# Patient Record
Sex: Male | Born: 1980 | Race: White | Hispanic: No | Marital: Married | Smoking: Current every day smoker
Health system: Southern US, Community
[De-identification: ages and names within clinical notes are randomized; demographics above are authoritative.]

## PROBLEM LIST (undated history)

## (undated) DIAGNOSIS — Z9151 Personal history of suicidal behavior: Secondary | ICD-10-CM

## (undated) DIAGNOSIS — F329 Major depressive disorder, single episode, unspecified: Secondary | ICD-10-CM

## (undated) DIAGNOSIS — A419 Sepsis, unspecified organism: Secondary | ICD-10-CM

## (undated) DIAGNOSIS — Z915 Personal history of self-harm: Secondary | ICD-10-CM

## (undated) DIAGNOSIS — F32A Depression, unspecified: Secondary | ICD-10-CM

## (undated) DIAGNOSIS — N200 Calculus of kidney: Secondary | ICD-10-CM

## (undated) DIAGNOSIS — S0285XA Fracture of orbit, unspecified, initial encounter for closed fracture: Secondary | ICD-10-CM

## (undated) DIAGNOSIS — F419 Anxiety disorder, unspecified: Secondary | ICD-10-CM

## (undated) DIAGNOSIS — F4310 Post-traumatic stress disorder, unspecified: Secondary | ICD-10-CM

## (undated) DIAGNOSIS — S2239XA Fracture of one rib, unspecified side, initial encounter for closed fracture: Secondary | ICD-10-CM

## (undated) HISTORY — PX: RENAL ARTERY STENT: SHX2321

## (undated) HISTORY — PX: LITHOTRIPSY: SUR834

---

## 2014-12-20 ENCOUNTER — Emergency Department (HOSPITAL_COMMUNITY): Payer: Self-pay

## 2014-12-20 ENCOUNTER — Emergency Department (HOSPITAL_COMMUNITY)
Admission: EM | Admit: 2014-12-20 | Discharge: 2014-12-23 | Disposition: A | Discharge status: Home / Self Care | Payer: Self-pay | Attending: Emergency Medicine | Admitting: Emergency Medicine

## 2014-12-20 ENCOUNTER — Encounter (HOSPITAL_COMMUNITY): Payer: Self-pay | Admitting: Emergency Medicine

## 2014-12-20 DIAGNOSIS — Y929 Unspecified place or not applicable: Secondary | ICD-10-CM | POA: Insufficient documentation

## 2014-12-20 DIAGNOSIS — S0012XA Contusion of left eyelid and periocular area, initial encounter: Secondary | ICD-10-CM | POA: Insufficient documentation

## 2014-12-20 DIAGNOSIS — Z79899 Other long term (current) drug therapy: Secondary | ICD-10-CM | POA: Insufficient documentation

## 2014-12-20 DIAGNOSIS — F1994 Other psychoactive substance use, unspecified with psychoactive substance-induced mood disorder: Secondary | ICD-10-CM | POA: Diagnosis present

## 2014-12-20 DIAGNOSIS — F332 Major depressive disorder, recurrent severe without psychotic features: Secondary | ICD-10-CM | POA: Diagnosis present

## 2014-12-20 DIAGNOSIS — S29001A Unspecified injury of muscle and tendon of front wall of thorax, initial encounter: Secondary | ICD-10-CM | POA: Insufficient documentation

## 2014-12-20 DIAGNOSIS — F1910 Other psychoactive substance abuse, uncomplicated: Secondary | ICD-10-CM | POA: Diagnosis present

## 2014-12-20 DIAGNOSIS — F4310 Post-traumatic stress disorder, unspecified: Secondary | ICD-10-CM | POA: Diagnosis present

## 2014-12-20 DIAGNOSIS — S3991XA Unspecified injury of abdomen, initial encounter: Secondary | ICD-10-CM | POA: Insufficient documentation

## 2014-12-20 DIAGNOSIS — F1410 Cocaine abuse, uncomplicated: Secondary | ICD-10-CM | POA: Insufficient documentation

## 2014-12-20 DIAGNOSIS — W1839XA Other fall on same level, initial encounter: Secondary | ICD-10-CM | POA: Insufficient documentation

## 2014-12-20 DIAGNOSIS — Y939 Activity, unspecified: Secondary | ICD-10-CM | POA: Insufficient documentation

## 2014-12-20 DIAGNOSIS — F329 Major depressive disorder, single episode, unspecified: Secondary | ICD-10-CM | POA: Insufficient documentation

## 2014-12-20 DIAGNOSIS — F1310 Sedative, hypnotic or anxiolytic abuse, uncomplicated: Secondary | ICD-10-CM | POA: Insufficient documentation

## 2014-12-20 DIAGNOSIS — R45851 Suicidal ideations: Secondary | ICD-10-CM

## 2014-12-20 DIAGNOSIS — F1210 Cannabis abuse, uncomplicated: Secondary | ICD-10-CM | POA: Insufficient documentation

## 2014-12-20 DIAGNOSIS — Z8781 Personal history of (healed) traumatic fracture: Secondary | ICD-10-CM | POA: Insufficient documentation

## 2014-12-20 DIAGNOSIS — Y999 Unspecified external cause status: Secondary | ICD-10-CM | POA: Insufficient documentation

## 2014-12-20 DIAGNOSIS — F419 Anxiety disorder, unspecified: Secondary | ICD-10-CM | POA: Insufficient documentation

## 2014-12-20 HISTORY — DX: Anxiety disorder, unspecified: F41.9

## 2014-12-20 HISTORY — DX: Depression, unspecified: F32.A

## 2014-12-20 HISTORY — DX: Fracture of one rib, unspecified side, initial encounter for closed fracture: S22.39XA

## 2014-12-20 HISTORY — DX: Major depressive disorder, single episode, unspecified: F32.9

## 2014-12-20 HISTORY — DX: Fracture of orbit, unspecified, initial encounter for closed fracture: S02.85XA

## 2014-12-20 LAB — URINE MICROSCOPIC-ADD ON

## 2014-12-20 LAB — RAPID URINE DRUG SCREEN, HOSP PERFORMED
AMPHETAMINES: NOT DETECTED
Barbiturates: NOT DETECTED
Benzodiazepines: POSITIVE — AB
Cocaine: POSITIVE — AB
Opiates: NOT DETECTED
Tetrahydrocannabinol: POSITIVE — AB

## 2014-12-20 LAB — COMPREHENSIVE METABOLIC PANEL
ALBUMIN: 4.5 g/dL (ref 3.5–5.0)
ALT: 22 U/L (ref 17–63)
AST: 27 U/L (ref 15–41)
Alkaline Phosphatase: 52 U/L (ref 38–126)
Anion gap: 9 (ref 5–15)
BILIRUBIN TOTAL: 0.5 mg/dL (ref 0.3–1.2)
BUN: 11 mg/dL (ref 6–20)
CALCIUM: 9.7 mg/dL (ref 8.9–10.3)
CO2: 25 mmol/L (ref 22–32)
CREATININE: 0.91 mg/dL (ref 0.61–1.24)
Chloride: 107 mmol/L (ref 101–111)
GFR calc Af Amer: >60 mL/min (ref >60)
Glucose, Bld: 94 mg/dL (ref 65–99)
Potassium: 4.4 mmol/L (ref 3.5–5.1)
SODIUM: 141 mmol/L (ref 135–145)
TOTAL PROTEIN: 7.5 g/dL (ref 6.5–8.1)

## 2014-12-20 LAB — CBC
HCT: 47.9 % (ref 39.0–52.0)
Hemoglobin: 16 g/dL (ref 13.0–17.0)
MCH: 30.8 pg (ref 26.0–34.0)
MCHC: 33.4 g/dL (ref 30.0–36.0)
MCV: 92.3 fL (ref 78.0–100.0)
PLATELETS: 229 10*3/uL (ref 150–400)
RBC: 5.19 MIL/uL (ref 4.22–5.81)
RDW: 13.6 % (ref 11.5–15.5)
WBC: 12.7 10*3/uL — ABNORMAL HIGH (ref 4.0–10.5)

## 2014-12-20 LAB — URINALYSIS, ROUTINE W REFLEX MICROSCOPIC
Glucose, UA: NEGATIVE mg/dL
Ketones, ur: 15 mg/dL — AB
Nitrite: POSITIVE — AB
PROTEIN: 30 mg/dL — AB
Specific Gravity, Urine: 1.033 — ABNORMAL HIGH (ref 1.005–1.030)
UROBILINOGEN UA: 1 mg/dL (ref 0.0–1.0)
pH: 7 (ref 5.0–8.0)

## 2014-12-20 LAB — SALICYLATE LEVEL: Salicylate Lvl: <4 mg/dL (ref 2.8–30.0)

## 2014-12-20 LAB — ETHANOL

## 2014-12-20 LAB — ACETAMINOPHEN LEVEL: Acetaminophen (Tylenol), Serum: <10 ug/mL — ABNORMAL LOW (ref 10–30)

## 2014-12-20 MED ORDER — CLONAZEPAM 0.5 MG PO TABS
1.0000 mg | ORAL_TABLET | Freq: Three times a day (TID) | ORAL | Status: DC | PRN
  Administered 2014-12-20 – 2014-12-21 (×2): 1 mg via ORAL
  Filled 2014-12-20 (×2): qty 2

## 2014-12-20 MED ORDER — CLONAZEPAM 0.5 MG PO TABS
1.0000 mg | ORAL_TABLET | Freq: Four times a day (QID) | ORAL | Status: DC
  Administered 2014-12-21 – 2014-12-23 (×8): 1 mg via ORAL
  Filled 2014-12-20 (×8): qty 2

## 2014-12-20 MED ORDER — HYDROXYZINE HCL 25 MG PO TABS
25.0000 mg | ORAL_TABLET | Freq: Three times a day (TID) | ORAL | Status: DC | PRN
  Administered 2014-12-22 (×2): 25 mg via ORAL
  Filled 2014-12-20 (×2): qty 1

## 2014-12-20 MED ORDER — ZIPRASIDONE MESYLATE 20 MG IM SOLR
20.0000 mg | Freq: Once | INTRAMUSCULAR | Status: AC
  Administered 2014-12-20: 20 mg via INTRAMUSCULAR

## 2014-12-20 MED ORDER — KETOROLAC TROMETHAMINE 60 MG/2ML IM SOLN
30.0000 mg | Freq: Once | INTRAMUSCULAR | Status: AC
  Administered 2014-12-20: 30 mg via INTRAMUSCULAR
  Filled 2014-12-20: qty 2

## 2014-12-20 MED ORDER — SULFAMETHOXAZOLE-TRIMETHOPRIM 800-160 MG PO TABS
1.0000 | ORAL_TABLET | Freq: Once | ORAL | Status: AC
  Administered 2014-12-21: 1 via ORAL
  Filled 2014-12-20: qty 1

## 2014-12-20 MED ORDER — NICOTINE 21 MG/24HR TD PT24
21.0000 mg | MEDICATED_PATCH | Freq: Every day | TRANSDERMAL | Status: DC
  Administered 2014-12-20 – 2014-12-23 (×4): 21 mg via TRANSDERMAL
  Filled 2014-12-20 (×6): qty 1

## 2014-12-20 MED ORDER — LOPERAMIDE HCL 1 MG/5ML PO LIQD
2.0000 mg | Freq: Once | ORAL | Status: AC
  Administered 2014-12-20: 2 mg via ORAL
  Filled 2014-12-20: qty 10

## 2014-12-20 NOTE — ED Notes (Addendum)
Pt is becoming very manipulative with staff and stating to the sitter that he is getting ready to get very loud. Informed the sitter that he will be getting his klonopin and will be moving to pod c. Security made aware and asked if [employer] could stay with pt in pod C for a while.

## 2014-12-20 NOTE — ED Notes (Signed)
Pt yelling in triage, demanding pain medicine and to be seen by a physician. Starting walking out of triage. Yelling to call security. Threatening to leave then walking back to his seat. Security called.

## 2014-12-20 NOTE — Progress Notes (Signed)
Patient was referred at: [facility] - per Lupita Leash, 1 bed open, fax referral. [facility] - per Thayer Ohm, fax referral for review. [facility] - per Olegario Messier, fax referral. [facility] - per intake, fax it for wait-list. [facility] - per Suann Larry, fax referral for wait-list. [facility] - per Britta Mccreedy, fax referral for review. [facility] - per intake, fax it for review.  At capacity: [facility] - per Luis Abed - per Berdine Dance - per Doyce Para - [facility]   CSW will continue to seek placement.  Melbourne Abts, LCSWA Disposition staff 12/20/2014 9:57 PM

## 2014-12-20 NOTE — ED Notes (Signed)
Sitter at bedside; Pt now denying SI. States RN misunderstood him. RN explained protocols will still be followed and he will be evaluated by mental health.

## 2014-12-20 NOTE — ED Notes (Signed)
Staffing called.  

## 2014-12-20 NOTE — ED Notes (Signed)
Pt upset demanding pain medication, CHarge RN at bedside, Littie Deeds, MD at bedside
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
## 2014-12-20 NOTE — ED Notes (Signed)
Pt states, "I cannot hold anything down by mouth. I am vomiting, peeing & puking blood. I want to talk to the attending doctor. I do not want this sitter in my room with her smug remarks. I need an IV because I cannot hold anything down. I will not take anything by mouth. I have a broken orbital & I am agitated." Charge RN & Littie Deeds, MD aware of situation & pts concerns, a new sitter is at bedside per pt request

## 2014-12-20 NOTE — ED Notes (Signed)
Pt requesting something for diarrhea. amb to the bathroom with security and sitter.
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
## 2014-12-20 NOTE — ED Notes (Signed)
[place], Consulting civil engineer at bedside, pt informed that Littie Deeds, MD will not be returning to the bedside until the pts medical hx is faxed over from [place], MD aware of pts concerns

## 2014-12-20 NOTE — ED Notes (Signed)
Refaxed IVC paperwork. 

## 2014-12-20 NOTE — ED Notes (Signed)
Pt reports that while he was washing his hands in the bathroom his head hit the mirror in the bathroom and broke the mirror. Pt then asked to sit in the chair and not get up because he reports "I am unstable on my feet." pt has been walking throughout triage with no difficulty ambulating. This RN and Matt EMT then hear a trashcan moving on the floor. Come to assess pt and pt is laying on the floor. Sitter states that pt laid himself on the floor and hit himself on the trash can. Once again asked pt to stay in his chair.

## 2014-12-20 NOTE — ED Notes (Signed)
Pt in paper scrubs.
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
## 2014-12-20 NOTE — ED Notes (Signed)
Called for triage x3. No answer 

## 2014-12-20 NOTE — ED Notes (Signed)
This RN sat and talked with pt and calmed him. Explained the process and protocols and why they have to be in place. Pt seems to accept and understand them at this time.

## 2014-12-20 NOTE — ED Notes (Signed)
Faxed IVC Paperwork to [employer] office

## 2014-12-21 DIAGNOSIS — F1994 Other psychoactive substance use, unspecified with psychoactive substance-induced mood disorder: Secondary | ICD-10-CM | POA: Diagnosis present

## 2014-12-21 DIAGNOSIS — F1910 Other psychoactive substance abuse, uncomplicated: Secondary | ICD-10-CM | POA: Diagnosis present

## 2014-12-21 DIAGNOSIS — F4310 Post-traumatic stress disorder, unspecified: Secondary | ICD-10-CM | POA: Diagnosis present

## 2014-12-21 DIAGNOSIS — R45851 Suicidal ideations: Secondary | ICD-10-CM

## 2014-12-21 DIAGNOSIS — F332 Major depressive disorder, recurrent severe without psychotic features: Secondary | ICD-10-CM | POA: Diagnosis present

## 2014-12-21 MED ORDER — ARIPIPRAZOLE 10 MG PO TABS
20.0000 mg | ORAL_TABLET | Freq: Every day | ORAL | Status: DC
Start: 2014-12-22 — End: 2014-12-23
  Administered 2014-12-22 – 2014-12-23 (×2): 20 mg via ORAL
  Filled 2014-12-21 (×2): qty 2

## 2014-12-21 MED ORDER — HALOPERIDOL LACTATE 5 MG/ML IJ SOLN
10.0000 mg | Freq: Once | INTRAMUSCULAR | Status: AC
  Administered 2014-12-21: 10 mg via INTRAMUSCULAR
  Filled 2014-12-21: qty 2

## 2014-12-21 MED ORDER — PRAZOSIN HCL 2 MG PO CAPS
2.0000 mg | ORAL_CAPSULE | Freq: Every day | ORAL | Status: DC
  Filled 2014-12-21 (×3): qty 1

## 2014-12-21 MED ORDER — PREGABALIN 75 MG PO CAPS
75.0000 mg | ORAL_CAPSULE | Freq: Three times a day (TID) | ORAL | Status: DC
  Administered 2014-12-21 (×2): 75 mg via ORAL
  Filled 2014-12-21 (×2): qty 1

## 2014-12-21 MED ORDER — PREGABALIN 75 MG PO CAPS
75.0000 mg | ORAL_CAPSULE | Freq: Three times a day (TID) | ORAL | Status: DC
  Administered 2014-12-21 – 2014-12-23 (×5): 75 mg via ORAL
  Filled 2014-12-21 (×5): qty 1

## 2014-12-21 MED ORDER — CITALOPRAM HYDROBROMIDE 10 MG PO TABS
60.0000 mg | ORAL_TABLET | Freq: Every day | ORAL | Status: DC
  Administered 2014-12-21 – 2014-12-23 (×3): 60 mg via ORAL
  Filled 2014-12-21 (×3): qty 6

## 2014-12-21 MED ORDER — LORAZEPAM 2 MG/ML IJ SOLN
2.0000 mg | Freq: Once | INTRAMUSCULAR | Status: AC
Start: 2014-12-21 — End: 2014-12-21
  Administered 2014-12-21: 2 mg via INTRAMUSCULAR
  Filled 2014-12-21: qty 1

## 2014-12-21 MED ORDER — MIRTAZAPINE 15 MG PO TABS
15.0000 mg | ORAL_TABLET | Freq: Every day | ORAL | Status: DC
  Administered 2014-12-21 – 2014-12-22 (×2): 15 mg via ORAL
  Filled 2014-12-21 (×4): qty 1

## 2014-12-21 MED ORDER — PANTOPRAZOLE SODIUM 40 MG PO TBEC
40.0000 mg | DELAYED_RELEASE_TABLET | Freq: Every day | ORAL | Status: DC
  Administered 2014-12-21 – 2014-12-23 (×3): 40 mg via ORAL
  Filled 2014-12-21 (×3): qty 1

## 2014-12-21 MED ORDER — DIVALPROEX SODIUM ER 500 MG PO TB24
750.0000 mg | ORAL_TABLET | Freq: Every day | ORAL | Status: DC
  Filled 2014-12-21: qty 1

## 2014-12-21 MED ORDER — SULFAMETHOXAZOLE-TRIMETHOPRIM 800-160 MG PO TABS
1.0000 | ORAL_TABLET | Freq: Two times a day (BID) | ORAL | Status: DC
Start: 2014-12-21 — End: 2014-12-23
  Administered 2014-12-21 – 2014-12-23 (×4): 1 via ORAL
  Filled 2014-12-21 (×4): qty 1
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
## 2014-12-21 NOTE — ED Notes (Signed)
Pt agitated, pt reports episode of diarrhea incontinence, pt provided hygeine items & new scrubs, pt states, "I feel like no one is trying to meet my needs & do anything for my agitation."

## 2014-12-21 NOTE — ED Notes (Signed)
Pt agitated sitting in hall, states, "I do not want to get agitated like yesterday. I don't know what I got yesterday but it helped the best. I just am upset that I lost this job. I don't want to get so worked up." [employer] at bedside talking with the pt, pt continues to pace in the hall

## 2014-12-21 NOTE — ED Notes (Signed)
Pt asked again to step back in his room and as soon as he calms down, he can speak to a doctor.  [employer] talking to pt right now.

## 2014-12-21 NOTE — ED Notes (Signed)
Pt agitated &  Ambulating in the hall, pt stating, "I cannot handle this I need to talk to someone. I am about to become violent. I pay the damn bills for these doctors. I don't want the sitter following me everywhere." pt will not go into his room after many attempts to redirect him into his room for a conversation on his plan of care, pt continues to raise his voice, [employer] speaking with the pt, hannah with social work at the bedside discussing plan of care, pt remains agitated, Adriana Simas, MD aware, pt to receive medication d/t agitation, pts requesting for medication for agitation

## 2014-12-21 NOTE — ED Notes (Signed)
As soon as pt received the shot he requested, his demeanor changed and he is currently polite and saying please and thank you without any foul language either.

## 2014-12-21 NOTE — Progress Notes (Signed)
LCSW addressed IVC as patient is currently committed and paperwork was faxed from [employer]. Paperwork placed on patient's chart in ED.    Patient is very agitated sitting in hallway reporting he cannot take this, GPD at bedside and LCSW brought patient in room to discuss plan.  Patient reports he is agitated because he hit his head on mirror, broke the mirror, and this has not been addressed.  When discussing with RN and reviewing notes, patient was addressed by MD and there was no intervention needed at this time.   Patient requesting medication to calm him down as well as medication for his headache. Patient shows no evidence of abrasion or contusion at this time.  He received medication, laying in bed and calm with GPD at his bedside.  Patient updated on treatment plan as he already knew and agreeable for inpatient treatment. He presents irritable, angry, and manipulative, attempting to medication seek.    Still seeking placement.  Deretha Emory, MSW Clinical Social Work: Emergency Room [phone]

## 2014-12-21 NOTE — ED Notes (Signed)
Per social workers request from [facility] to call Ricardo Sims re: questions re: pts abx tx, Rosey Bath @ [phone] was contacted & updated on pts status, per Rosey Bath she will be in contact with [facility] re: the possibility of the pt meeting acceptance criteria, currently Ricardo Sims does not have any openings at this time & placement at this facility today is not likely, [facility] to provide update on pt plan of care

## 2014-12-21 NOTE — ED Notes (Signed)
Pt asleep, Dahlia Client with social work to review IVC paperwork, sitter at bedside
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
## 2014-12-21 NOTE — ED Notes (Signed)
Pt has calmed down enough and is sitting in his room, requesting the same shot of medication that he received last night.  Informed Dr. Gwendolyn Grant who will be over to talk to pt shortly.

## 2014-12-21 NOTE — ED Notes (Signed)
Pt is yelling at staff about his situation, cursing and threatening to hit a wall.  Pt informed that nurse will talk to him once he has calmed down.  Security at door.

## 2014-12-21 NOTE — Progress Notes (Signed)
Rosey Bath at [facility] [phone]) requested updates on clinicals for a possible admission. RN Toniann Fail aware.  Melbourne Abts, LCSWA Disposition staff 12/21/2014 4:43 PM

## 2014-12-21 NOTE — ED Notes (Signed)
Ricardo Simas, MD paged per pts request, pt agitated & ambulating in the hall, pt encouraged to return to room, pt requesting to speak with social work

## 2014-12-21 NOTE — Progress Notes (Signed)
LCSW following as patient ?under IVC. Call placed to civil and criminal magistrate.  Unknown at this time if patient has been served. LCSW called Dagoberto Reef of Court and awaiting paperwork to be sent over from [employer].    Awaiting call back.  At this time, it appears patient has not been served his Findings and Custody order, thus he is voluntary.  Once paperwork can either be located to deemed not complete, LCSW will facilitate next steps in effort to determine if appropriate to IVC.  Patient to be re-assessed this morning.  Deretha Emory, MSW Clinical Social Work: Emergency Room [phone]

## 2014-12-21 NOTE — ED Notes (Signed)
Pt states he "demands to speak to someone," pt told that he needs to calm down in order for him to talk to someone.

## 2014-12-22 MED ORDER — ZIPRASIDONE MESYLATE 20 MG IM SOLR
10.0000 mg | Freq: Once | INTRAMUSCULAR | Status: AC
Start: 2014-12-22 — End: 2014-12-22
  Administered 2014-12-22: 10 mg via INTRAMUSCULAR
  Filled 2014-12-22: qty 20

## 2014-12-22 MED ORDER — ZIPRASIDONE MESYLATE 20 MG IM SOLR: 20.0000 mg | Freq: Once | INTRAMUSCULAR | Status: DC

## 2014-12-22 NOTE — ED Notes (Signed)
Pt in hallway stating that he did not know that he had papers taken out on him, even though he has been told several times. Pt becoming more and more upset and aggressive. Pt walking toward desk aggressively toward this RN and Kaitlin EMT. [employer] responded, pt became more upset stating "why are you walking toward me like that man." EDP phoned. This RN received a verbal order for Geodon . Pt sts that he wants to take this medication to "help me calm down."

## 2014-12-22 NOTE — Progress Notes (Signed)
Called Ricardo Sims to check pt's referral status as information shared in shift report indicated intake had called 12/21/14 p.m. Requesting updated clinical information. Jill Alexanders at [facility] states he does not have any information re: that request, that pt was scheduled for an assessment at [facility] on 12/16/14 and did not attend, stated he "went to an ED instead." Jill Alexanders advises to re-fax referral and it will be reviewed for [facility] waitlist. CSW faxed referral and will follow up.  Ilean Skill, MSW, LCSW Clinical Social Work, Disposition  12/22/2014 [phone]

## 2014-12-22 NOTE — Progress Notes (Addendum)
Referral was followed up at: [facility]- per Victorino Dike- referral received and under review. [facility] - per Nicholos Johns, fax referral, not sure about d/c tomorrow. [facility]- refax per Caryn Bee, "Might be that we just didn't get to it today, try following up later." [facility]- per Renea Ee, referral not received, re-faxed. [facility]- attempted to reach multiple times but the phone connection failed, will try contacting later.  Declined at: [facility] - due to behavior acuity, per French Ana. [facility] - due SA  At capacity: [facility]  CSW will continue to seek placement.  Melbourne Abts, LCSWA Disposition staff 12/22/2014 3:48 PM

## 2014-12-22 NOTE — ED Notes (Signed)
Pt requesting pain medication for orbital fracture.

## 2014-12-22 NOTE — Progress Notes (Signed)
Followed up on placement efforts:  Referred to: [facility]- per Jill Alexanders (see previous note by this Clinical research associate) [facility]- refax per [place], referral not received 12/21/14 [facility]- per Victorino Dike- should be reviewing referrals this afternoon [facility]- per Renea Ee, same as above [facility]- will review per Dahlia Client   At capacity for appropriate units: [facility] (will only be able to take referrals for females on adult unit until further notice) [facility]  Left voicemail for [facility] and will refer as appropriate.  Ilean Skill, MSW, LCSW Clinical Social Work, Disposition  12/22/2014 [phone]

## 2014-12-22 NOTE — ED Notes (Addendum)
Pt c/o increased anxiety and agitation. Pt thinks he is going to become aggressive soon. Pt stating "Can I just have another shot (Geodon) and not have to worry about it." Pt informed that Geodon is not a drug that is given routinely to patients. EDP aware of pt complaints. No new orders at this time. Pt reminded that he gets his scheduled medications at 2200.

## 2014-12-22 NOTE — ED Notes (Signed)
Pt stood in doorway while this RN administered Geodon IM with [employer] and [facility] security present. Pt was not restrained and took the medication willingly.

## 2014-12-23 DIAGNOSIS — F332 Major depressive disorder, recurrent severe without psychotic features: Secondary | ICD-10-CM

## 2014-12-23 LAB — URINALYSIS, ROUTINE W REFLEX MICROSCOPIC
Glucose, UA: NEGATIVE mg/dL
Hgb urine dipstick: NEGATIVE
KETONES UR: NEGATIVE mg/dL
LEUKOCYTES UA: NEGATIVE
NITRITE: NEGATIVE
PH: 5.5 (ref 5.0–8.0)
Protein, ur: NEGATIVE mg/dL
SPECIFIC GRAVITY, URINE: 1.029 (ref 1.005–1.030)
UROBILINOGEN UA: 0.2 mg/dL (ref 0.0–1.0)

## 2014-12-23 MED ORDER — PRAZOSIN HCL 2 MG PO CAPS: 2.0000 mg | ORAL_CAPSULE | Freq: Every day | ORAL | Status: DC

## 2014-12-23 MED ORDER — CITALOPRAM HYDROBROMIDE 20 MG PO TABS: 60.0000 mg | ORAL_TABLET | Freq: Every day | ORAL | Status: DC

## 2014-12-23 MED ORDER — IBUPROFEN 200 MG PO TABS
600.0000 mg | ORAL_TABLET | Freq: Four times a day (QID) | ORAL | Status: DC | PRN
  Administered 2014-12-23: 600 mg via ORAL
  Filled 2014-12-23 (×2): qty 1

## 2014-12-23 MED ORDER — CLONAZEPAM 1 MG PO TABS: 1.0000 mg | ORAL_TABLET | Freq: Three times a day (TID) | ORAL | Status: DC | PRN

## 2014-12-23 MED ORDER — MIRTAZAPINE 15 MG PO TABS: 15.0000 mg | ORAL_TABLET | Freq: Every day | ORAL | Status: DC

## 2014-12-23 NOTE — ED Notes (Signed)
Discussed d/c instructions w/ [facility] and Dr. Jodi Mourning. Pt to follow-up with [facility].

## 2014-12-23 NOTE — ED Notes (Signed)
Visitor at bedside.

## 2014-12-23 NOTE — ED Notes (Signed)
Pt ambulatory w/ steady gait to restroom. 

## 2014-12-23 NOTE — ED Provider Notes (Signed)
Discuss case with behavior health no active suicidal ideation at this time. Vitals normal this morning. They have arranged outpatient follow-up for the patient. Psychiatry asked me to write prescriptions for patient until he has an appointment outpatient.   Blane Ohara Enid Skeens, MD 12/23/14 1124

## 2014-12-23 NOTE — ED Notes (Addendum)
Pt cursing and yelling after being notified that he will not be able to have visitors since he was smoking in the bathroom. Pt reports, "My wife did not bring them in, I had them on me already." "I've been smoking in here for 3 days." Pt agitated yelling and cursing. [employer] and security at bedside. Pt gave security one cigarette and a lighter Pt talking to them saying, "I don't care about none of this, I've been to prison and we can go back to the prison days." "You take something from me, I will take something from you." Spoke with charge RN, plan of care re: visitors is that they must be wanded and security at bedside during visitations.

## 2014-12-23 NOTE — ED Notes (Signed)
Pt c/o neck pain - EDP aware.

## 2014-12-23 NOTE — ED Notes (Signed)
TTS consulting pt via telepsych.
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
## 2014-12-23 NOTE — Progress Notes (Signed)
Spoke with Dr. Lucianne Muss who has evaluated pt today and recommends d/c with follow up at current provider, [facility]. Pt indicates he has to go to walk-in clinic as he has not current appointment with [facility]. CSW provided walk-in clinic information in D/c follow up instructions. No other barriers to d/c at this time. CSW available to assist as needed.  Ilean Skill, MSW, LCSW Clinical Social Work, Disposition  12/23/2014 [phone]
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
## 2014-12-23 NOTE — Progress Notes (Addendum)
LCSW followed up with patient. Patient in room talking with sitter about his children and resting in bed. Patient is alert and oriented, in no apparent distress. Actively engaged in conversation about his family, wife, children. Patient is laughing and joking with staff. Not reporting SI/HI or AVH  Deretha Emory, MSW Clinical Social Work: Emergency Room [phone]

## 2014-12-23 NOTE — Discharge Instructions (Signed)
If you were given medicines take as directed.  If you are on coumadin or contraceptives realize their levels and effectiveness is altered by many different medicines.  If you have any reaction (rash, tongues swelling, other) to the medicines stop taking and see a physician.    If your blood pressure was elevated in the ER make sure you follow up for management with a primary doctor or return for chest pain, shortness of breath or stroke symptoms.  Please follow up as directed and return to the ER or see a physician for new or worsening symptoms.  Thank you. Filed Vitals:   12/22/14 0917 12/22/14 1200 12/22/14 1800 12/23/14 0622  BP: 115/61 101/52 96/51 95/49   Pulse: 51 53 53 60  Temp: 97.8 F (36.6 C) 97.8 F (36.6 C) 98.9 F (37.2 C) 97.9 F (36.6 C)  TempSrc: Oral  Oral Oral  Resp: 18 17 18 18   SpO2: 100% 98% 98% 97%    Emergency Department Resource Guide 1) Find a Doctor and Pay Out of Pocket Although you won't have to find out who is covered by your insurance plan, it is a good idea to ask around and get recommendations. You will then need to call the office and see if the doctor you have chosen will accept you as a new patient and what types of options they offer for patients who are self-pay. Some doctors offer discounts or will set up payment plans for their patients who do not have insurance, but you will need to ask so you aren't surprised when you get to your appointment.  2) Contact Your Local Health Department Not all health departments have doctors that can see patients for sick visits, but many do, so it is worth a call to see if yours does. If you don't know where your local health department is, you can check in your phone book. The CDC also has a tool to help you locate your state's health department, and many state websites also have listings of all of their local health departments.  3) Find a Walk-in Clinic If your illness is not likely to be very severe or complicated,  you may want to try a walk in clinic. These are popping up all over the country in pharmacies, drugstores, and shopping centers. They're usually staffed by nurse practitioners or physician assistants that have been trained to treat common illnesses and complaints. They're usually fairly quick and inexpensive. However, if you have serious medical issues or chronic medical problems, these are probably not your best option.  No Primary Care Doctor: - Call Health Connect at  [phone] - they can help you locate a primary care doctor that  accepts your insurance, provides certain services, etc. - Physician Referral Service- [phone]  Chronic Pain Problems: Organization         Address  Phone   Notes  [facility]  [phone] Patients need to be referred by their primary care doctor.   Medication Assistance: Organization         Address  Phone   Notes  [facility] Medication [facility] [address] [place], [place] [address] [phone] --Must be a resident of [facility] -- Must have NO insurance coverage whatsoever (no Medicaid/ Medicare, etc.) -- The pt. MUST have a primary care doctor that directs their care regularly and follows them in the community   [facility]  [phone]   [address]  [phone]  Agencies that provide inexpensive medical care: Organization         Address  Phone   Notes  [facility]  [phone]   [facility]    [phone]   [facility] [address] [place], [place] [address] [phone]   [facility] [address] [place] [place] [phone]   Planned Parenthood    [phone]   [facility]    [phone]   [facility]  [address], [place] Phone:  [phone], Fax:  [phone] Hours of Operation:  9 am - 6 pm, M-F.  Also accepts Medicaid/Medicare and  self-pay.  [facility]  [address], [place] Phone: [phone], Fax: [phone]. Hours of Operation:  8:30 am - 5:30 pm, M-F.  Also accepts Medicaid and self-pay.  [facility] [place] [address], [place] Phone: [phone]   [facility] [address] Natasha Bence [place], [place] [phone], Ext. [phone] Mondays & Thursdays: 7-9 AM.  First 15 patients are seen on a first come, first serve basis.    Medicaid-accepting [facility] Providers:  Organization         Address  Phone   Notes  [facility] [address],  [phone] Also accepts self-pay patients.  [facility] [address] Laurell Josephs [place], [place]  [phone]   [facility] [address], [place] [phone]   [facility] [address] [place] [phone]   Renaye Rakers [address], [place]   [phone] Only accepts [place] Access [place] patients after they have their name applied to their card.   Self-Pay (no insurance) in [facility]:  Organization         Address  Phone   Notes  Sickle Cell Patients, [facility] Internal Medicine [address] [place], [place] [phone]   [facility] [address] [place], [place] [phone]   [facility] [place]  [address], [place] [phone]   [facility]/Dr. Osei-Bonsu  [address], [place] or [address], [place] [phone] Phone number for both [place] and [place] locations is the same.  [facility] [address], [place] [phone]   [facility] [address], [place] or [address] [phone]   [facility] [address] [place] [phone], phone;  [phone], fax Sees patients 1st and 3rd Saturday of every month.  Must not qualify for public or private insurance (i.e. Medicaid, Medicare, [place] Health Choice, Veterans' Benefits)  Household income should be no more than 200% of the poverty level The clinic cannot treat you if you are pregnant or think you are pregnant  Sexually transmitted diseases are not treated at the clinic.    Dental Care: Organization         Address  Phone  Notes  [facility] [address] [place], [place] [phone] Accepts children up to age 106 who are enrolled in [place] or [place] Health Choice; pregnant women with a Medicaid card; and children who have applied for Medicaid or  [place] Health Choice, but were declined, whose parents can pay a reduced fee at time of service.  [facility] of [facility]  [address], [place] [phone] Accepts children up to age 41 who are enrolled in [place] or [place] Health Choice; pregnant women with a Medicaid card; and children who have applied for Medicaid or [place] Health Choice, but were declined, whose parents can pay a reduced fee at time of service.  [facility]  [address] [place], [place] [phone] Patients are seen by appointment only. Walk-ins are not accepted. [facility] will see patients 56 years of age and older. Monday - Tuesday (8am-5pm) Most Wednesdays (8:30-5pm) $30 per visit, cash only  [facility] Adult [facility] Access [facility]  [address], [facility] [phone] Patients are seen by appointment only. Walk-ins are not accepted. [facility] will see patients 41 years of age and older. One Wednesday Evening (Monthly: Volunteer Based).  $30 per visit, cash only  [employer]  [phone] for adults; Children under age 13, call Graduate Pediatric Dentistry at [phone]. Children aged 8-14, please call  [phone] to request a pediatric application.  Dental services are provided in all areas of dental care including fillings, crowns and bridges, complete and partial dentures, implants, gum treatment, root canals, and extractions. Preventive care is also provided. Treatment is provided to both adults and children. Patients are selected via a lottery and there is often a waiting list.   [facility] [address] [place]  [phone] [email or web address]   [facility] [address] [place], [place] [phone], Ext. [phone] Second and Fourth Thursday of each month, opens at 6:30 AM; Clinic ends at 9 AM.  Patients are seen on a first-come first-served basis, and a limited number are seen during each clinic.   [facility]  [address] Ether Griffins [place], [place] [phone]   Eligibility Requirements You must have lived in [place], [place], or [place] counties for at least the last three months.   You cannot be eligible for state or federal sponsored [place], including [employer], [place], or [employer].   You generally cannot be eligible for healthcare insurance through your employer.    How to apply: Eligibility screenings are held every Tuesday and Wednesday afternoon from 1:00 pm until 4:00 pm. You do not need an appointment for the interview!  [facility] [address] [place], [place] [phone]   [facility] Health Department  [phone]   [facility] Health Department  [phone]   [facility] Health Department  [phone]    Behavioral Health Resources in the Community: Intensive Outpatient Programs Organization         Address  Phone  Notes  [facility] [address], [place], [place] [phone]   [facility] Outpatient [address] [place], [place] [phone]   [facility] [address] [place], [place]   [phone]   [facility] Mental Health [address]  [place], [place] [phone] or [phone]   Substance Abuse Resources Organization         Address  Phone  Notes  [facility]  [phone]   [facility]  [phone]   [facility] [place]  [phone]   Floydene Flock  [phone]   Residential & Outpatient Substance Abuse Program  [phone]  Psychological Services Organization         Address  Phone  Notes  [facility] Behavioral Health  [phone]   [facility]  [phone]   [facility] [phone], [place] [phone] or [phone]    Mobile Crisis Teams Organization         Address  Phone  Notes  [facility], Mobile Crisis Care Unit  [phone]   [facility]  [address] [place], [place] [phone]   Doristine Locks [address] [place] [phone]    Self-Help/Support Groups Organization         Address  Phone             Notes  [facility] [place] - variety of support groups  [phone]- [phone] Call for more information  Narcotics Anonymous (NA), [facility] [address], [employer] [place]  2 meetings at this location   Statistician         Address  Phone  Notes  [facility] [address],    [place]  [phone]   [facility]  [address], [place] [address], [place], [place] [phone]   [facility] [address] [place], [place] [phone] Admissions: 8am-3pm M-F  [facility] [address]    [place], [place] [phone]   [facility] [address] [place], [place], [place] [phone]   [facility] [address]  [place], [place] [phone]   [facility] - Intensive Outpatient [address] Laurell Josephs [address], [place], [place] [phone]   [facility] (Addiction Recovery  [facility] [address] [place].,  [place], [place] [phone] or [phone]   [facility] ([facility]) [address] [place], [place] [phone] Accepts Medicaid  [facility] [address]  [place] [phone] Substance Abuse/Addiction Treatment   [facility] Organization         Address  Phone  Notes  [facility]  [phone]   Angie Fava, PhD [address] Ervin Knack [place], [place]   [phone] or [phone]   [facility] Behavioral   [address] [place], [place] [phone]   [facility] [address], [place], [place] [phone] Insurance/Medicaid/sponsorship through [facility] and Families [address]                                    [place], [place] [phone] Therapy/tele-psych/case  [facility] [address], [place] [phone]    Dr. Lolly Mustache  [phone]   [facility]  [employer] [facility] Dept. 1) [address], [place] 2) [address] [place] 3)  [address], [place] [phone]  [phone]   [facility] Child Abuse Hotline [phone] or [phone] (After Hours)

## 2014-12-24 LAB — URINE CULTURE: Culture: NO GROWTH

## 2015-01-20 ENCOUNTER — Emergency Department (HOSPITAL_COMMUNITY): Payer: Self-pay

## 2015-01-20 ENCOUNTER — Emergency Department (HOSPITAL_COMMUNITY)
Admission: EM | Admit: 2015-01-20 | Discharge: 2015-01-20 | Disposition: A | Discharge status: Home / Self Care | Payer: Self-pay | Attending: Emergency Medicine | Admitting: Emergency Medicine

## 2015-01-20 ENCOUNTER — Encounter (HOSPITAL_COMMUNITY): Payer: Self-pay | Admitting: Emergency Medicine

## 2015-01-20 DIAGNOSIS — R110 Nausea: Secondary | ICD-10-CM | POA: Insufficient documentation

## 2015-01-20 DIAGNOSIS — Z79899 Other long term (current) drug therapy: Secondary | ICD-10-CM | POA: Insufficient documentation

## 2015-01-20 DIAGNOSIS — Z8781 Personal history of (healed) traumatic fracture: Secondary | ICD-10-CM | POA: Insufficient documentation

## 2015-01-20 DIAGNOSIS — F329 Major depressive disorder, single episode, unspecified: Secondary | ICD-10-CM | POA: Insufficient documentation

## 2015-01-20 DIAGNOSIS — F419 Anxiety disorder, unspecified: Secondary | ICD-10-CM | POA: Insufficient documentation

## 2015-01-20 DIAGNOSIS — Z720 Tobacco use: Secondary | ICD-10-CM | POA: Insufficient documentation

## 2015-01-20 DIAGNOSIS — R319 Hematuria, unspecified: Secondary | ICD-10-CM | POA: Insufficient documentation

## 2015-01-20 DIAGNOSIS — R109 Unspecified abdominal pain: Secondary | ICD-10-CM | POA: Insufficient documentation

## 2015-01-20 DIAGNOSIS — R3915 Urgency of urination: Secondary | ICD-10-CM | POA: Insufficient documentation

## 2015-01-20 DIAGNOSIS — R3919 Other difficulties with micturition: Secondary | ICD-10-CM | POA: Insufficient documentation

## 2015-01-20 DIAGNOSIS — Z87442 Personal history of urinary calculi: Secondary | ICD-10-CM | POA: Insufficient documentation

## 2015-01-20 DIAGNOSIS — R509 Fever, unspecified: Secondary | ICD-10-CM | POA: Insufficient documentation

## 2015-01-20 HISTORY — DX: Calculus of kidney: N20.0

## 2015-01-20 LAB — URINALYSIS, ROUTINE W REFLEX MICROSCOPIC
Bilirubin Urine: NEGATIVE
GLUCOSE, UA: NEGATIVE mg/dL
KETONES UR: 15 mg/dL — AB
NITRITE: NEGATIVE
PH: 7.5 (ref 5.0–8.0)
Protein, ur: NEGATIVE mg/dL
SPECIFIC GRAVITY, URINE: 1.014 (ref 1.005–1.030)
Urobilinogen, UA: 1 mg/dL (ref 0.0–1.0)

## 2015-01-20 LAB — URINE MICROSCOPIC-ADD ON

## 2015-01-20 MED ORDER — OXYCODONE-ACETAMINOPHEN 5-325 MG PO TABS
1.0000 | ORAL_TABLET | Freq: Once | ORAL | Status: AC
  Administered 2015-01-20: 1 via ORAL

## 2015-01-20 MED ORDER — ONDANSETRON HCL 4 MG PO TABS: 4.0000 mg | ORAL_TABLET | Freq: Four times a day (QID) | ORAL | Status: DC

## 2015-01-20 MED ORDER — NAPROXEN 500 MG PO TABS: 500.0000 mg | ORAL_TABLET | Freq: Two times a day (BID) | ORAL | Status: DC

## 2015-01-20 MED ORDER — OXYCODONE-ACETAMINOPHEN 5-325 MG PO TABS
ORAL_TABLET | ORAL | Status: AC
  Filled 2015-01-20: qty 1

## 2015-01-20 NOTE — ED Notes (Signed)
PA at bedside.

## 2015-01-20 NOTE — Discharge Instructions (Signed)
Flank Pain °Flank pain refers to pain that is located on the side of the body between the upper abdomen and the back. The pain may occur over a short period of time (acute) or may be long-term or reoccurring (chronic). It may be mild or severe. Flank pain can be caused by many things. °CAUSES  °Some of the more common causes of flank pain include: °· Muscle strains.   °· Muscle spasms.   °· A disease of your spine (vertebral disk disease).   °· A lung infection (pneumonia).   °· Fluid around your lungs (pulmonary edema).   °· A kidney infection.   °· Kidney stones.   °· A very painful skin rash caused by the chickenpox virus (shingles).   °· Gallbladder disease.   °HOME CARE INSTRUCTIONS  °Home care will depend on the cause of your pain. In general, °· Rest as directed by your caregiver. °· Drink enough fluids to keep your urine clear or pale yellow. °· Only take over-the-counter or prescription medicines as directed by your caregiver. Some medicines may help relieve the pain. °· Tell your caregiver about any changes in your pain. °· Follow up with your caregiver as directed. °SEEK IMMEDIATE MEDICAL CARE IF:  °· Your pain is not controlled with medicine.   °· You have new or worsening symptoms. °· Your pain increases.   °· You have abdominal pain.   °· You have shortness of breath.   °· You have persistent nausea or vomiting.   °· You have swelling in your abdomen.   °· You feel faint or pass out.   °· You have blood in your urine. °· You have a fever or persistent symptoms for more than 2-3 days. °· You have a fever and your symptoms suddenly get worse. °MAKE SURE YOU:  °· Understand these instructions. °· Will watch your condition. °· Will get help right away if you are not doing well or get worse. °Document Released: 06/13/2005 Document Revised: 01/15/2012 Document Reviewed: 12/05/2011 °ExitCare® Patient Information ©2015 [employer]. This information is not intended to replace advice given to you by your  health care provider. Make sure you discuss any questions you have with your health care provider. ° °

## 2015-01-20 NOTE — ED Provider Notes (Signed)
CSN: [id number]     Arrival date & time 01/20/15  1242 History   First MD Initiated Contact with Patient 01/20/15 1610     Chief Complaint  Patient presents with  . Flank Pain   HPI  Ricardo Sims is a 34 year old male with PMHx of kidney stones presenting with right sided flank pain. Pt states the pain began yesterday and has been constant with intermittent episodes of increasing severity. Associated symptoms include nausea and increased urgency to urinate. Pt states he woke this morning and felt an urge to urinate but could "only get a few drops out". Pt states he took his flowmax and tried to urinate again. After straining for awhile, he "felt a huge gush of urine". States he had frank hematuria at this time. He has had many nephrolithiasis in the past and reports this feels like when he passes the stones. Pt was not straining his urine at the time so is unsure if a stone actually passed. Pt also reports feeling feverish last night with temperatures "around 99.6 mostly". Denies headaches, chest pain, SOB, vomiting, dysuria, diarrhea or constipation. Pt [facility] a [facility] and follows with a urologist there. He has an appointment scheduled for 9/19 to discuss his frequent kidney stones.  Past Medical History  Diagnosis Date  . Rib fracture   . Orbital fracture   . Depression   . Anxiety   . Kidney stone    Past Surgical History  Procedure Laterality Date  . Lithotripsy     No family history on file. Social History  Substance Use Topics  . Smoking status: Current Every Day Smoker -- 0.50 packs/day  . Smokeless tobacco: None  . Alcohol Use: No    Review of Systems  Constitutional: Positive for fever. Negative for chills and diaphoresis.  Respiratory: Negative for shortness of breath.   Cardiovascular: Negative for chest pain.  Gastrointestinal: Positive for nausea. Negative for vomiting, abdominal pain and diarrhea.  Genitourinary: Positive for urgency, hematuria, flank pain  and difficulty urinating. Negative for dysuria.  Neurological: Negative for headaches.      Allergies  Review of patient's allergies indicates no known allergies.  Home Medications   Prior to Admission medications   Medication Sig Start Date End Date Taking? Authorizing Provider  ARIPiprazole (ABILIFY) 20 MG tablet Take 20 mg by mouth daily.    Historical Provider, MD  citalopram (CELEXA) 20 MG tablet Take 3 tablets (60 mg total) by mouth daily. 12/23/14   Blane Ohara, MD  clonazePAM (KLONOPIN) 1 MG tablet Take 1 tablet (1 mg total) by mouth 3 (three) times daily as needed for anxiety. 12/23/14   Blane Ohara, MD  divalproex (DEPAKOTE ER) 250 MG 24 hr tablet Take 750 mg by mouth daily.    Historical Provider, MD  hydrOXYzine (ATARAX/VISTARIL) 25 MG tablet Take 25 mg by mouth 3 (three) times daily as needed for anxiety.    Historical Provider, MD  mirtazapine (REMERON) 15 MG tablet Take 1 tablet (15 mg total) by mouth at bedtime. 12/23/14   Blane Ohara, MD  naproxen (NAPROSYN) 500 MG tablet Take 1 tablet (500 mg total) by mouth 2 (two) times daily. 01/20/15   Stevi Barrett, PA-C  omeprazole (PRILOSEC) 40 MG capsule Take 40 mg by mouth daily.    Historical Provider, MD  ondansetron (ZOFRAN) 4 MG tablet Take 1 tablet (4 mg total) by mouth every 6 (six) hours. 01/20/15   Stevi Barrett, PA-C  Oxycodone HCl 20 MG  TABS Take 20 mg by mouth every 4 (four) hours as needed (pain).    Historical Provider, MD  prazosin (MINIPRESS) 2 MG capsule Take 1 capsule (2 mg total) by mouth at bedtime. 12/23/14   Blane Ohara, MD  pregabalin (LYRICA) 75 MG capsule Take 75 mg by mouth 3 (three) times daily.    Historical Provider, MD   BP 138/78 mmHg  Pulse 84  Temp(Src) 98.4 F (36.9 C) (Oral)  Resp 16  Ht 5\' 8"  (1.727 m)  Wt 170 lb (77.111 kg)  BMI 25.85 kg/m2  SpO2 99% Physical Exam  Constitutional: He appears well-developed and well-nourished. No distress.  HENT:  Head: Normocephalic and atraumatic.   Eyes: Conjunctivae are normal. Right eye exhibits no discharge. Left eye exhibits no discharge. No scleral icterus.  Neck: Normal range of motion.  Cardiovascular: Normal rate, regular rhythm and normal heart sounds.   Pulmonary/Chest: Effort normal and breath sounds normal. No respiratory distress. He has no wheezes. He has no rales.  Abdominal: Soft. He exhibits no distension. There is no tenderness. There is CVA tenderness (right). There is no rebound and no guarding.  Musculoskeletal: Normal range of motion.  Neurological: He is alert. Coordination normal.  Skin: Skin is warm and dry.  Psychiatric: He has a normal mood and affect. His behavior is normal.  Nursing note and vitals reviewed.   ED Course  Procedures (including critical care time) Labs Review Labs Reviewed  URINALYSIS, ROUTINE W REFLEX MICROSCOPIC (NOT AT [facility]) - Abnormal; Notable for the following:    Color, Urine RED (*)    APPearance TURBID (*)    Hgb urine dipstick LARGE (*)    Ketones, ur 15 (*)    Leukocytes, UA SMALL (*)    All other components within normal limits  URINE MICROSCOPIC-ADD ON    Imaging Review Ct Renal Stone Study  01/20/2015   CLINICAL DATA:  Constant RIGHT flank pain, hematuria, kidney stones, thinks he passed a stone last night, nausea, vomiting, low grade fever, smoker  EXAM: CT ABDOMEN AND PELVIS WITHOUT CONTRAST  TECHNIQUE: Multidetector CT imaging of the abdomen and pelvis was performed following the standard protocol without IV contrast. Sagittal and coronal MPR images reconstructed from axial data set. Oral contrast not administered for this indication.  COMPARISON:  None  FINDINGS: Minimal bibasilar atelectasis.  Tiny BILATERAL nonobstructing renal calculi.  No hydronephrosis, ureteral calcification or ureteral dilatation.  Bladder, ureters, and prostate gland grossly normal appearance.  Within limits of a nonenhanced exam, liver, spleen, pancreas, kidneys, and LEFT adrenal gland otherwise  normal.  Low-attenuation RIGHT adrenal mass 2.3 x 1.3 cm image 22 compatible with adenoma.  Normal appendix without inflammatory changes.  Multiple normal size RIGHT lower quadrant lymph nodes near cecum, nonspecific.  Stomach and bowel loops otherwise unremarkable.  Small umbilical hernia containing fat.  No mass, adenopathy, free air, or free fluid.  Osseous structures unremarkable.  IMPRESSION: BILATERAL nonobstructing renal calculi.  Small RIGHT adrenal adenoma.  Small umbilical hernia containing fat.  No acute abnormalities.   Electronically Signed   By: Ulyses Southward M.D.   On: 01/20/2015 17:12   I have personally reviewed and evaluated these images and lab results as part of my medical decision-making.   EKG Interpretation None      MDM   Final diagnoses:  Flank pain   Pt presenting with right flank pain x 1 day. Pt with urinary symptoms as described above. Pt most likely had kidney stone that he passed  before presentation to emergency department. Pt reports nausea has resolved and pain is improving without intervention in ED. VSS. Mild right CVA tenderness. CT stone showing non-obstructing renal calculi; no ureteral stone or dilation. Pt has appointment with his urologist scheduled for Monday. Pt concerned about pain and nausea returning; given zofran and naprosyn prescription for home. Return precautions discussed with pt and given in discharge paperwork. Pt stable for discharge.     Rolm Gala Barrett, PA-C 01/20/15 2206  Zadie Rhine, MD 01/21/15 [phone]

## 2015-01-20 NOTE — ED Notes (Signed)
Pt reports R flank pain consistant with kidney stones. Pt reports hematuria but thinks he passed 1 stone last night.

## 2015-01-20 NOTE — ED Notes (Signed)
Pt also reports intermittent low grade fever and nv.

## 2015-02-05 ENCOUNTER — Encounter (HOSPITAL_COMMUNITY): Payer: Self-pay | Admitting: Nurse Practitioner

## 2015-02-05 ENCOUNTER — Emergency Department (HOSPITAL_COMMUNITY)
Admission: EM | Admit: 2015-02-05 | Discharge: 2015-02-06 | Disposition: A | Discharge status: Other Acute Inpatient Hospital | Payer: Self-pay | Attending: Emergency Medicine | Admitting: Emergency Medicine

## 2015-02-05 DIAGNOSIS — F329 Major depressive disorder, single episode, unspecified: Secondary | ICD-10-CM | POA: Insufficient documentation

## 2015-02-05 DIAGNOSIS — R45851 Suicidal ideations: Secondary | ICD-10-CM

## 2015-02-05 DIAGNOSIS — F4310 Post-traumatic stress disorder, unspecified: Secondary | ICD-10-CM | POA: Insufficient documentation

## 2015-02-05 DIAGNOSIS — R197 Diarrhea, unspecified: Secondary | ICD-10-CM | POA: Insufficient documentation

## 2015-02-05 DIAGNOSIS — R002 Palpitations: Secondary | ICD-10-CM | POA: Insufficient documentation

## 2015-02-05 DIAGNOSIS — F419 Anxiety disorder, unspecified: Secondary | ICD-10-CM | POA: Insufficient documentation

## 2015-02-05 DIAGNOSIS — F1994 Other psychoactive substance use, unspecified with psychoactive substance-induced mood disorder: Secondary | ICD-10-CM | POA: Insufficient documentation

## 2015-02-05 DIAGNOSIS — Z87442 Personal history of urinary calculi: Secondary | ICD-10-CM | POA: Insufficient documentation

## 2015-02-05 DIAGNOSIS — R51 Headache: Secondary | ICD-10-CM | POA: Insufficient documentation

## 2015-02-05 DIAGNOSIS — Z79899 Other long term (current) drug therapy: Secondary | ICD-10-CM | POA: Insufficient documentation

## 2015-02-05 DIAGNOSIS — Z720 Tobacco use: Secondary | ICD-10-CM | POA: Insufficient documentation

## 2015-02-05 DIAGNOSIS — R109 Unspecified abdominal pain: Secondary | ICD-10-CM | POA: Insufficient documentation

## 2015-02-05 HISTORY — DX: Calculus of kidney: N20.0

## 2015-02-05 HISTORY — DX: Post-traumatic stress disorder, unspecified: F43.10

## 2015-02-05 LAB — COMPREHENSIVE METABOLIC PANEL
ALBUMIN: 4.4 g/dL (ref 3.5–5.0)
ALT: 17 U/L (ref 17–63)
AST: 22 U/L (ref 15–41)
Alkaline Phosphatase: 45 U/L (ref 38–126)
Anion gap: 9 (ref 5–15)
BUN: 15 mg/dL (ref 6–20)
CHLORIDE: 102 mmol/L (ref 101–111)
CO2: 26 mmol/L (ref 22–32)
CREATININE: 1.08 mg/dL (ref 0.61–1.24)
Calcium: 9.3 mg/dL (ref 8.9–10.3)
GFR calc Af Amer: >60 mL/min (ref >60)
GFR calc non Af Amer: >60 mL/min (ref >60)
GLUCOSE: 101 mg/dL — AB (ref 65–99)
POTASSIUM: 4.1 mmol/L (ref 3.5–5.1)
SODIUM: 137 mmol/L (ref 135–145)
Total Bilirubin: 0.4 mg/dL (ref 0.3–1.2)
Total Protein: 7.3 g/dL (ref 6.5–8.1)

## 2015-02-05 LAB — CBC
HEMATOCRIT: 49 % (ref 39.0–52.0)
Hemoglobin: 16.7 g/dL (ref 13.0–17.0)
MCH: 30.4 pg (ref 26.0–34.0)
MCHC: 34.1 g/dL (ref 30.0–36.0)
MCV: 89.1 fL (ref 78.0–100.0)
Platelets: 229 10*3/uL (ref 150–400)
RBC: 5.5 MIL/uL (ref 4.22–5.81)
RDW: 13.2 % (ref 11.5–15.5)
WBC: 13.4 10*3/uL — AB (ref 4.0–10.5)

## 2015-02-05 MED ORDER — DICYCLOMINE HCL 10 MG PO CAPS
10.0000 mg | ORAL_CAPSULE | Freq: Once | ORAL | Status: AC
  Administered 2015-02-05: 10 mg via ORAL
  Filled 2015-02-05: qty 1

## 2015-02-05 MED ORDER — ONDANSETRON 4 MG PO TBDP
4.0000 mg | ORAL_TABLET | Freq: Once | ORAL | Status: AC
  Administered 2015-02-05: 4 mg via ORAL
  Filled 2015-02-05: qty 1

## 2015-02-05 MED ORDER — CLONIDINE HCL 0.1 MG PO TABS
0.1000 mg | ORAL_TABLET | Freq: Once | ORAL | Status: AC
  Administered 2015-02-05: 0.1 mg via ORAL
  Filled 2015-02-05: qty 1
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
## 2015-02-05 NOTE — ED Notes (Signed)
Pt able to ambulate to restroom.  Denies any discomfort at this time.

## 2015-02-05 NOTE — ED Provider Notes (Signed)
CSN: [id number]     Arrival date & time 02/05/15  1821 History   First MD Initiated Contact with Patient 02/05/15 2011     Chief Complaint  Patient presents with  . Nausea    Patient is a 34 y.o. male presenting with mental health disorder. The history is provided by the patient and medical records.  Mental Health Problem Presenting symptoms: agitation, depression, homicidal ideas (about his friend who was also his previous boss, does not state he has an actual plan for this) and suicidal thoughts (no plan)   Presenting symptoms: no hallucinations   Degree of incapacity (severity):  Moderate Onset quality:  Gradual Duration:  1 week Timing:  Constant Progression:  Worsening Chronicity:  New Context: noncompliance (states has been off all medications for 1 wk because of insurance issues)   Context: not alcohol use and not drug abuse   Treatment compliance:  Untreated Time since last psychoactive medication taken:  1 week Worsened by:  Family interactions Associated symptoms: abdominal pain, anxiety, headaches and irritability   Associated symptoms: no chest pain   Associated symptoms comment:  States having nausea, diarrhea, sweats, headaches, diffuse crampy abdominal pain since stopping meds   Past Medical History  Diagnosis Date  . Rib fracture   . Orbital fracture (HCC)   . Depression   . Anxiety   . Kidney stone   . PTSD (post-traumatic stress disorder)   . Nephrolithiasis    Past Surgical History  Procedure Laterality Date  . Lithotripsy    . Lithotripsy    . Renal artery stent     History reviewed. No pertinent family history. Social History  Substance Use Topics  . Smoking status: Current Every Day Smoker -- 0.50 packs/day  . Smokeless tobacco: None  . Alcohol Use: No    Review of Systems  Constitutional: Positive for irritability. Negative for fever.  HENT: Negative for rhinorrhea.   Eyes: Negative for visual disturbance.  Respiratory: Negative for  shortness of breath.   Cardiovascular: Positive for palpitations. Negative for chest pain.  Gastrointestinal: Positive for nausea, abdominal pain and diarrhea.  Genitourinary: Negative for decreased urine volume.  Skin: Negative for rash.  Allergic/Immunologic: Negative for immunocompromised state.  Neurological: Positive for headaches.  Psychiatric/Behavioral: Positive for suicidal ideas (no plan), homicidal ideas (about his friend who was also his previous boss, does not state he has an actual plan for this) and agitation. Negative for hallucinations. The patient is nervous/anxious.       Allergies  Review of patient's allergies indicates no known allergies.  Home Medications   Prior to Admission medications   Medication Sig Start Date End Date Taking? Authorizing Provider  ARIPiprazole (ABILIFY) 20 MG tablet Take 20 mg by mouth daily.    Historical Provider, MD  citalopram (CELEXA) 20 MG tablet Take 3 tablets (60 mg total) by mouth daily. 12/23/14   Blane Ohara, MD  clonazePAM (KLONOPIN) 1 MG tablet Take 1 tablet (1 mg total) by mouth 3 (three) times daily as needed for anxiety. 12/23/14   Blane Ohara, MD  divalproex (DEPAKOTE ER) 250 MG 24 hr tablet Take 750 mg by mouth daily.    Historical Provider, MD  hydrOXYzine (ATARAX/VISTARIL) 25 MG tablet Take 25 mg by mouth 3 (three) times daily as needed for anxiety.    Historical Provider, MD  mirtazapine (REMERON) 15 MG tablet Take 1 tablet (15 mg total) by mouth at bedtime. 12/23/14   Blane Ohara, MD  naproxen (NAPROSYN) 500 MG tablet  Take 1 tablet (500 mg total) by mouth 2 (two) times daily. 01/20/15   Stevi Barrett, PA-C  omeprazole (PRILOSEC) 40 MG capsule Take 40 mg by mouth daily.    Historical Provider, MD  ondansetron (ZOFRAN) 4 MG tablet Take 1 tablet (4 mg total) by mouth every 6 (six) hours. 01/20/15   Stevi Barrett, PA-C  Oxycodone HCl 20 MG TABS Take 20 mg by mouth every 4 (four) hours as needed (pain).    Historical  Provider, MD  prazosin (MINIPRESS) 2 MG capsule Take 1 capsule (2 mg total) by mouth at bedtime. 12/23/14   Blane Ohara, MD  pregabalin (LYRICA) 75 MG capsule Take 75 mg by mouth 3 (three) times daily.    Historical Provider, MD   BP 141/85 mmHg  Pulse 71  Temp(Src) 99.5 F (37.5 C) (Oral)  Resp 23  Ht  (1.727 m)  Wt 163 lb 14.4 oz (74.345 kg)  BMI 24.93 kg/m2  SpO2 97% Physical Exam  Constitutional: He is oriented to person, place, and time. He appears well-developed and well-nourished. No distress.  HENT:  Head: Normocephalic and atraumatic.  Eyes: Right eye exhibits no discharge. Left eye exhibits no discharge.  Neck: No tracheal deviation present.  Cardiovascular: Normal rate and regular rhythm.   Pulmonary/Chest: Effort normal and breath sounds normal. No respiratory distress.  Abdominal: Soft. He exhibits no distension. There is no tenderness.  Musculoskeletal: He exhibits no edema.  Neurological: He is alert and oriented to person, place, and time.  Skin: Skin is warm and dry.  Psychiatric:  Anxious appearing. Jittery. States has had suicidal thoughts and his wife was concerned about these, but no plan currently, not sure he is suicidal today. Has had some homicidal thoughts about his friend/boss, but no plan currently. Denies hallucinations     ED Course  Procedures (including critical care time) Labs Review Labs Reviewed  COMPREHENSIVE METABOLIC PANEL - Abnormal; Notable for the following:    Glucose, Bld 101 (*)    All other components within normal limits  CBC - Abnormal; Notable for the following:    WBC 13.4 (*)    All other components within normal limits  URINALYSIS, ROUTINE W REFLEX MICROSCOPIC (NOT AT [facility])  URINE RAPID DRUG SCREEN, HOSP PERFORMED    Imaging Review No results found. I have personally reviewed and evaluated these images and lab results as part of my medical decision-making.   EKG Interpretation None      MDM   Final  diagnoses:  None    34 year old male with history of depression, substance abuse, PTSD, anxiety presenting with possible SI and HI as well as numerous symptoms described as above, in setting of suddenly stopping all medications one week ago due to insurance issues. Afebrile, hemodynamically stable. Exam is not concerning for acute intra-abdominal pathology. Symptoms likely attributable to withdrawal. Patient denies substance abuse including alcohol. Due to concerns about SI and HI, place and Behavioral Health holding area. Per psych assessment, patient meets inpatient criteria. Awaiting admission for further care. Most home medications ordered. Given symptomatic treatment for withdrawal symptoms.  Discussed with Dr. Rubin Payor, who oversaw management of this patient.  Urban Gibson, MD 02/06/15 0100  Benjiman Core, MD 02/06/15 [phone]

## 2015-02-05 NOTE — ED Notes (Signed)
He reports 3 day history nausea, sweats, loose bowel movements, "swallowing mor ethan normal." denies pain, sob, urinary changes. He reports he stopped taking all of his daily meds last Sunday due to loss of insurance coverage, and is unsure if htat is related to his symptoms. He is A&Ox4, resp e/u

## 2015-02-05 NOTE — ED Notes (Addendum)
Staffing made aware of need for sitter after this RN made aware of pt's SI status

## 2015-02-05 NOTE — ED Notes (Signed)
TTS machine at bedside.  Pt updated on POC at this time, and will change after his TTS assessment.

## 2015-02-05 NOTE — ED Notes (Signed)
Pt states he was taking Celexa, Depakote, Remron, Minipress, Abilify, Clonopine and Trazadone and abruptly stopped them a week ago.  Pt sts his insurance was cut off abruptly, and he was unable to refill his prescriptions.  Pt has attempted to be seen at [facility], but still had a [place] address at the time and was sent to [facility].  Pt states he had an appointment for this week, but he was not feeling well and so he missed his appointment.  Pt states he has been experiencing frustration with the circumstances, and feeling anxious.    Pt states he has been experiencing the following symptoms:  Cramping (especially after eating), anorexia, diarrhea, diaphoresis, depression (denying SI).

## 2015-02-06 ENCOUNTER — Encounter (HOSPITAL_COMMUNITY): Payer: Self-pay

## 2015-02-06 ENCOUNTER — Inpatient Hospital Stay (HOSPITAL_COMMUNITY)
Admission: AD | Admit: 2015-02-06 | Discharge: 2015-02-08 | DRG: 885 | Disposition: A | Discharge status: Home / Self Care | Payer: No Typology Code available for payment source | Source: Intra-hospital | Attending: Psychiatry | Admitting: Psychiatry

## 2015-02-06 DIAGNOSIS — F17210 Nicotine dependence, cigarettes, uncomplicated: Secondary | ICD-10-CM | POA: Diagnosis present

## 2015-02-06 DIAGNOSIS — F329 Major depressive disorder, single episode, unspecified: Secondary | ICD-10-CM

## 2015-02-06 DIAGNOSIS — R45851 Suicidal ideations: Secondary | ICD-10-CM | POA: Diagnosis present

## 2015-02-06 DIAGNOSIS — F419 Anxiety disorder, unspecified: Secondary | ICD-10-CM | POA: Diagnosis not present

## 2015-02-06 DIAGNOSIS — G4700 Insomnia, unspecified: Secondary | ICD-10-CM | POA: Diagnosis present

## 2015-02-06 DIAGNOSIS — F410 Panic disorder [episodic paroxysmal anxiety] without agoraphobia: Secondary | ICD-10-CM | POA: Diagnosis present

## 2015-02-06 DIAGNOSIS — F4310 Post-traumatic stress disorder, unspecified: Secondary | ICD-10-CM | POA: Diagnosis present

## 2015-02-06 DIAGNOSIS — F32A Depression, unspecified: Secondary | ICD-10-CM

## 2015-02-06 DIAGNOSIS — F332 Major depressive disorder, recurrent severe without psychotic features: Principal | ICD-10-CM | POA: Diagnosis present

## 2015-02-06 LAB — URINALYSIS, ROUTINE W REFLEX MICROSCOPIC
Bilirubin Urine: NEGATIVE
GLUCOSE, UA: NEGATIVE mg/dL
Hgb urine dipstick: NEGATIVE
KETONES UR: NEGATIVE mg/dL
LEUKOCYTES UA: NEGATIVE
Nitrite: NEGATIVE
PROTEIN: NEGATIVE mg/dL
Specific Gravity, Urine: 1.024 (ref 1.005–1.030)
UROBILINOGEN UA: 0.2 mg/dL (ref 0.0–1.0)
pH: 6 (ref 5.0–8.0)

## 2015-02-06 LAB — RAPID URINE DRUG SCREEN, HOSP PERFORMED
AMPHETAMINES: NOT DETECTED
BENZODIAZEPINES: NOT DETECTED
Barbiturates: NOT DETECTED
COCAINE: NOT DETECTED
OPIATES: NOT DETECTED
Tetrahydrocannabinol: NOT DETECTED

## 2015-02-06 MED ORDER — PRAZOSIN HCL 2 MG PO CAPS
2.0000 mg | ORAL_CAPSULE | Freq: Every day | ORAL | Status: DC
  Administered 2015-02-06 – 2015-02-07 (×2): 2 mg via ORAL
  Filled 2015-02-06: qty 7
  Filled 2015-02-06 (×3): qty 1

## 2015-02-06 MED ORDER — TRAZODONE HCL 100 MG PO TABS: 100.0000 mg | ORAL_TABLET | Freq: Every day | ORAL | Status: DC

## 2015-02-06 MED ORDER — MIRTAZAPINE 15 MG PO TABS
15.0000 mg | ORAL_TABLET | Freq: Every day | ORAL | Status: DC
  Administered 2015-02-06 – 2015-02-07 (×2): 15 mg via ORAL
  Filled 2015-02-06 (×3): qty 1
  Filled 2015-02-06: qty 7

## 2015-02-06 MED ORDER — DIVALPROEX SODIUM ER 500 MG PO TB24
750.0000 mg | ORAL_TABLET | Freq: Every morning | ORAL | Status: DC
  Administered 2015-02-06: 750 mg via ORAL
  Filled 2015-02-06 (×2): qty 1

## 2015-02-06 MED ORDER — ALUM & MAG HYDROXIDE-SIMETH 200-200-20 MG/5ML PO SUSP: 30.0000 mL | ORAL | Status: DC | PRN

## 2015-02-06 MED ORDER — ARIPIPRAZOLE 15 MG PO TABS
15.0000 mg | ORAL_TABLET | Freq: Every day | ORAL | Status: DC
  Administered 2015-02-07 – 2015-02-08 (×2): 15 mg via ORAL
  Filled 2015-02-06: qty 1
  Filled 2015-02-06: qty 14
  Filled 2015-02-06 (×2): qty 1

## 2015-02-06 MED ORDER — PNEUMOCOCCAL VAC POLYVALENT 25 MCG/0.5ML IJ INJ
0.5000 mL | INJECTION | INTRAMUSCULAR | Status: AC
  Administered 2015-02-07: 0.5 mL via INTRAMUSCULAR

## 2015-02-06 MED ORDER — ARIPIPRAZOLE 10 MG PO TABS
20.0000 mg | ORAL_TABLET | Freq: Every day | ORAL | Status: DC
  Administered 2015-02-06: 20 mg via ORAL
  Filled 2015-02-06 (×2): qty 2

## 2015-02-06 MED ORDER — NAPROXEN 500 MG PO TABS: 500.0000 mg | ORAL_TABLET | Freq: Two times a day (BID) | ORAL | Status: DC | PRN

## 2015-02-06 MED ORDER — ENSURE ENLIVE PO LIQD
237.0000 mL | Freq: Two times a day (BID) | ORAL | Status: DC
  Administered 2015-02-06 – 2015-02-08 (×4): 237 mL via ORAL

## 2015-02-06 MED ORDER — PANTOPRAZOLE SODIUM 40 MG PO TBEC
40.0000 mg | DELAYED_RELEASE_TABLET | Freq: Every day | ORAL | Status: DC
  Administered 2015-02-06 – 2015-02-08 (×3): 40 mg via ORAL
  Filled 2015-02-06 (×3): qty 1
  Filled 2015-02-06: qty 7
  Filled 2015-02-06 (×2): qty 1

## 2015-02-06 MED ORDER — LORATADINE 10 MG PO TABS
10.0000 mg | ORAL_TABLET | Freq: Once | ORAL | Status: AC
  Administered 2015-02-06: 10 mg via ORAL
  Filled 2015-02-06: qty 1

## 2015-02-06 MED ORDER — CLONAZEPAM 1 MG PO TABS
1.0000 mg | ORAL_TABLET | Freq: Two times a day (BID) | ORAL | Status: DC | PRN
  Administered 2015-02-06 – 2015-02-08 (×5): 1 mg via ORAL
  Filled 2015-02-06 (×6): qty 1

## 2015-02-06 MED ORDER — TRAZODONE HCL 100 MG PO TABS
100.0000 mg | ORAL_TABLET | Freq: Every day | ORAL | Status: DC
  Administered 2015-02-06 – 2015-02-07 (×2): 100 mg via ORAL
  Filled 2015-02-06: qty 7
  Filled 2015-02-06 (×3): qty 1

## 2015-02-06 MED ORDER — NICOTINE 21 MG/24HR TD PT24
21.0000 mg | MEDICATED_PATCH | Freq: Every day | TRANSDERMAL | Status: DC
  Administered 2015-02-07 – 2015-02-08 (×2): 21 mg via TRANSDERMAL
  Filled 2015-02-06 (×4): qty 1

## 2015-02-06 MED ORDER — PREGABALIN 75 MG PO CAPS
75.0000 mg | ORAL_CAPSULE | Freq: Three times a day (TID) | ORAL | Status: DC
  Administered 2015-02-06: 75 mg via ORAL
  Filled 2015-02-06: qty 1

## 2015-02-06 MED ORDER — PANTOPRAZOLE SODIUM 40 MG PO TBEC
40.0000 mg | DELAYED_RELEASE_TABLET | Freq: Every day | ORAL | Status: DC
  Administered 2015-02-06: 40 mg via ORAL
  Filled 2015-02-06: qty 1

## 2015-02-06 MED ORDER — HYDROXYZINE HCL 25 MG PO TABS
25.0000 mg | ORAL_TABLET | Freq: Three times a day (TID) | ORAL | Status: DC | PRN
  Filled 2015-02-06: qty 10

## 2015-02-06 MED ORDER — MAGNESIUM HYDROXIDE 400 MG/5ML PO SUSP: 30.0000 mL | Freq: Every day | ORAL | Status: DC | PRN

## 2015-02-06 MED ORDER — INFLUENZA VAC SPLIT QUAD 0.5 ML IM SUSY
0.5000 mL | PREFILLED_SYRINGE | INTRAMUSCULAR | Status: AC
  Administered 2015-02-07: 0.5 mL via INTRAMUSCULAR
  Filled 2015-02-06: qty 0.5

## 2015-02-06 MED ORDER — DIVALPROEX SODIUM ER 250 MG PO TB24
750.0000 mg | ORAL_TABLET | Freq: Every day | ORAL | Status: DC
  Administered 2015-02-07 – 2015-02-08 (×2): 750 mg via ORAL
  Filled 2015-02-06 (×2): qty 3
  Filled 2015-02-06: qty 21
  Filled 2015-02-06: qty 3

## 2015-02-06 MED ORDER — TRAZODONE HCL 100 MG PO TABS
100.0000 mg | ORAL_TABLET | Freq: Every day | ORAL | Status: DC
  Administered 2015-02-06: 100 mg via ORAL
  Filled 2015-02-06: qty 1

## 2015-02-06 MED ORDER — CITALOPRAM HYDROBROMIDE 10 MG PO TABS
60.0000 mg | ORAL_TABLET | Freq: Every day | ORAL | Status: DC
  Administered 2015-02-06: 60 mg via ORAL
  Filled 2015-02-06: qty 6

## 2015-02-06 MED ORDER — CITALOPRAM HYDROBROMIDE 40 MG PO TABS
40.0000 mg | ORAL_TABLET | Freq: Every day | ORAL | Status: DC
  Administered 2015-02-07 – 2015-02-08 (×2): 40 mg via ORAL
  Filled 2015-02-06: qty 7
  Filled 2015-02-06 (×3): qty 1

## 2015-02-06 MED ORDER — ACETAMINOPHEN 325 MG PO TABS: 650.0000 mg | ORAL_TABLET | Freq: Four times a day (QID) | ORAL | Status: DC | PRN

## 2015-02-06 MED ORDER — PRAZOSIN HCL 2 MG PO CAPS
2.0000 mg | ORAL_CAPSULE | Freq: Every day | ORAL | Status: DC
  Administered 2015-02-06: 2 mg via ORAL
  Filled 2015-02-06 (×2): qty 1

## 2015-02-06 NOTE — ED Notes (Signed)
Belongings/valuables inventoried, placed in Soiled utility/security safe by Su Hoff, RN prior to patient's arrival to [facility].

## 2015-02-06 NOTE — ED Notes (Signed)
Notified pharmacy x 2 for depakote.

## 2015-02-06 NOTE — ED Notes (Signed)
Dr. Jeraldine Loots made aware of patients request for Claritin for his allergies.
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
## 2015-02-06 NOTE — ED Provider Notes (Signed)
Patient has been accepted for inpatient treatment. Exception doctor is Dr. Dub Mikes. I have seen the patient. He is alert and interactive. He has no complaints. He has well appearance. EMTALA completed for transfer.  Arby Barrette, MD 02/06/15 [phone]

## 2015-02-06 NOTE — ED Notes (Signed)
Patient requesting medication for his allergies, patient presents with red, watery eyes that he reports are itching.

## 2015-02-06 NOTE — ED Notes (Signed)
Transfer to [facility].

## 2015-02-06 NOTE — BHH Counselor (Signed)
Called to speak with nurse Katie to advise that we will need the UDS results to be able to reach a final disposition.  Per record, specimen has not been collected yet.   Beryle Flock, MS, CRC, [facility] Triage Specialist [facility]

## 2015-02-06 NOTE — Progress Notes (Signed)
Pt was in bed resting at the time of group, since he came in late this morning.

## 2015-02-06 NOTE — ED Notes (Signed)
Pts valuables taken out of security by Apolinar Junes EMT. Valuables still in sealed envelope given to Amy RN.

## 2015-02-06 NOTE — ED Notes (Signed)
Voluntary Admission Consent form Signed by patient.

## 2015-02-06 NOTE — BHH Suicide Risk Assessment (Signed)
[facility] Admission Suicide Risk Assessment   Nursing information obtained from:    Demographic factors:    Current Mental Status:    Loss Factors:    Historical Factors:    Risk Reduction Factors:    Total Time spent with patient: 30 minutes Principal Problem: <principal problem not specified> Diagnosis:   Patient Active Problem List   Diagnosis Date Noted  . Depression with suicidal ideation [F32.9, R45.851] 02/06/2015  . Polysubstance abuse [F19.10] 12/21/2014  . Substance induced mood disorder ([facility]) [F19.94] 12/21/2014  . PTSD (post-traumatic stress disorder) [F43.10] 12/21/2014  . MDD (major depressive disorder), recurrent severe, without psychosis ([facility]) [F33.2] 12/21/2014     Continued Clinical Symptoms:    The "Alcohol Use Disorders Identification Test", Guidelines for Use in Primary Care, Second Edition.  World Science writer [facility]). Score between 0-7:  no or low risk or alcohol related problems. Score between 8-15:  moderate risk of alcohol related problems. Score between 16-19:  high risk of alcohol related problems. Score 20 or above:  warrants further diagnostic evaluation for alcohol dependence and treatment.   CLINICAL FACTORS:   Severe Anxiety and/or Agitation Panic Attacks Depression:   Impulsivity Severe    Psychiatric Specialty Exam: Physical Exam  ROS  Blood pressure 132/71, pulse 96, temperature 98.6 F (37 C), temperature source Oral, resp. rate 20, height  (1.676 m), weight 73.936 kg (163 lb), SpO2 98 %.Body mass index is 26.32 kg/(m^2).   COGNITIVE FEATURES THAT CONTRIBUTE TO RISK:  Closed-mindedness, Polarized thinking and Thought constriction (tunnel vision)    SUICIDE RISK:   Moderate:  Frequent suicidal ideation with limited intensity, and duration, some specificity in terms of plans, no associated intent, good self-control, limited dysphoria/symptomatology, some risk factors present, and identifiable protective factors, including  available and accessible social support.  PLAN OF CARE: see Admission H and P  Medical Decision Making:  Review of Psycho-Social Stressors (1), Review or order clinical lab tests (1), Review of Medication Regimen & Side Effects (2) and Review of New Medication or Change in Dosage (2)  I certify that inpatient services furnished can reasonably be expected to improve the patient's condition.   Tanetta Fuhriman A 02/06/2015, 5:39 PM

## 2015-02-06 NOTE — BHH Counselor (Signed)
Referral packet sent to [place], Abran Cantor, [address] [place], [place], [place], and [place]. Jacarius Handel, NCC, LPC-A, LCAS-A  Counselor 02/06/2015 2:36 AM

## 2015-02-06 NOTE — ED Notes (Signed)
Pt updated on POC and agreed to sleep the night and be re-assessed in the am after a "good night's sleep".  This RN provided patient with a warm blanket and other comfort measures.  Pt removed from the monitor and resting in bed at this time.
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
## 2015-02-06 NOTE — BHH Group Notes (Signed)
BHH LCSW Group Therapy  02/06/2015 1:05 PM  Type of Therapy:  Group Therapy  Participation Level:  Did Not Attend-pt new to unit. Chose to rest in room.   Modes of Intervention:  Confrontation, Discussion, Education, Exploration, Problem-solving, Rapport Building, Socialization and Support  Summary of Progress/Problems: Today's Topic: Overcoming Obstacles. Patients identified one short term goal and potential obstacles in reaching this goal. Patients processed barriers involved in overcoming these obstacles. Patients identified steps necessary for overcoming these obstacles and explored motivation (internal and external) for facing these difficulties head on.   Smart, Hiliary Osorto LCSWA  02/06/2015, 1:05 PM

## 2015-02-06 NOTE — Progress Notes (Signed)
Per Inetta Fermo, [facility], pt accepted to [facility] bed 307-2 by Dr. Dub Mikes. Pt can arrive anytime, admission is voluntary. Report number for RN is [phone]. Spoke with [facility] re: pt placement at [facility].  Ilean Skill, MSW, LCSW Clinical Social Work, Disposition  02/06/2015 [phone]
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
## 2015-02-07 DIAGNOSIS — F332 Major depressive disorder, recurrent severe without psychotic features: Principal | ICD-10-CM

## 2015-02-07 MED ORDER — PREGABALIN 75 MG PO CAPS
75.0000 mg | ORAL_CAPSULE | Freq: Three times a day (TID) | ORAL | Status: DC
  Administered 2015-02-07 – 2015-02-08 (×4): 75 mg via ORAL
  Filled 2015-02-07 (×4): qty 1

## 2015-02-07 NOTE — Progress Notes (Signed)
NUTRITION ASSESSMENT  Pt identified as at risk on the Malnutrition Screen Tool  INTERVENTION: 1. Supplements: Continue Ensure Enlive po BID, each supplement provides 350 kcal and 20 grams of protein  NUTRITION DIAGNOSIS: Unintentional weight loss related to sub-optimal intake as evidenced by pt report.   Goal: Pt to meet >/= 90% of their estimated nutrition needs.  Monitor:  PO intake  Assessment:  Pt admitted with depression and PTSD. PMHx of polysubstance abuse and alcohol abuse. Pt reports poor appetite and has lost 7 lb since 9/16 (4% weight loss x 2.5 weeks, significant for time frame). Pt has been ordered Ensure supplements BID.   Height: Ht Readings from Last 1 Encounters:  02/06/15  (1.676 m)    Weight: Wt Readings from Last 1 Encounters:  02/06/15 163 lb (73.936 kg)    Weight Hx: Wt Readings from Last 10 Encounters:  02/06/15 163 lb (73.936 kg)  02/05/15 163 lb 14.4 oz (74.345 kg)  01/20/15 170 lb (77.111 kg)    BMI:  Body mass index is 26.32 kg/(m^2). Pt meets criteria for overweight based on current BMI.  Estimated Nutritional Needs: Kcal: 25-30 kcal/kg Protein: > 1 gram protein/kg Fluid: 1 ml/kcal  Diet Order: Diet Heart Room service appropriate?: Yes; Fluid consistency:: Thin Pt is also offered choice of unit snacks mid-morning and mid-afternoon.  Pt is eating as desired.   Lab results and medications reviewed.   Tilda Franco, MS, RD, LDN Pager: [phone] After Hours Pager: [phone]

## 2015-02-07 NOTE — Progress Notes (Signed)
Pt has been in bed since the beginning of the shift.  He was admitted sometime after lunch.  He says that he did not get much sleep since going to the ED, so he would like to go to bed early.  Pt's vital signs were checked shortly after 8 pm and he was given his medications, so that he could go to bed early.  He denies SI/HI/AVH.  He was encouraged to make his needs known to staff.  Discharge plans are in process.  Support and encouragement offered.  Safety maintained with q15 minute checks.

## 2015-02-07 NOTE — Progress Notes (Signed)
[facility] MD Progress Note  02/07/2015 6:29 PM Ricardo Sims  MRN:  [id number] Subjective:  Ricardo Sims was placed on his regular medications. States he is starting to feel a little more stable. He is concerned about being able to afford them before he is able to get his medical insurance back. States he has a job waiting for him, but he is afraid that without the medications something might happen while at work what will cause him to lose his job. He is complaining of pain in his back going to his leg Principal Problem: MDD (major depressive disorder), recurrent severe, without psychosis ([name]) Diagnosis:   Patient Active Problem List   Diagnosis Date Noted  . Depression with suicidal ideation [F32.9, R45.851] 02/06/2015  . Polysubstance abuse [F19.10] 12/21/2014  . Substance induced mood disorder ([place]) [F19.94] 12/21/2014  . PTSD (post-traumatic stress disorder) [F43.10] 12/21/2014  . MDD (major depressive disorder), recurrent severe, without psychosis ([name]) [F33.2] 12/21/2014   Total Time spent with patient: 30 minutes  Past Psychiatric History: See admission H and P  Past Medical History:  Past Medical History  Diagnosis Date  . Rib fracture   . Orbital fracture ([place])   . Depression   . Anxiety   . Kidney stone   . PTSD (post-traumatic stress disorder)   . Nephrolithiasis     Past Surgical History  Procedure Laterality Date  . Lithotripsy    . Lithotripsy    . Renal artery stent     Family History: History reviewed. No pertinent family history. Family Psychiatric  History: see Admission H and P Social History:  History  Alcohol Use No     History  Drug Use No    Social History   Social History  . Marital Status: Married    Spouse Name: N/A  . Number of Children: N/A  . Years of Education: N/A   Social History Main Topics  . Smoking status: Current Every Day Smoker -- 0.50 packs/day  . Smokeless tobacco: None  . Alcohol Use: No  . Drug Use: No  . Sexual Activity: Not  Asked   Other Topics Concern  . None   Social History Narrative   Additional Social History:    Pain Medications: n/a Prescriptions: see PTA list Over the Counter: n/a History of alcohol / drug use?: Yes Name of Substance 1: currently denies Name of Substance 2: Marijuana 2 - Age of First Use: 14 2 - Amount (size/oz): 1 bowl 2 - Frequency: 2 x week in last yr (with daily use between 15-19/34 yo) 2 - Duration: from 14 until recently 2 - Last Use / Amount: "last week, a bowl or two" Name of Substance 3: currently denies Name of Substance 4: Nicotine 4 - Age of First Use: 16 4 - Amount (size/oz): 1 pack a day 4 - Frequency: daily 4 - Duration: since 16 4 - Last Use / Amount: yesterday            Sleep: Fair  Appetite:  Fair  Current Medications: Current Facility-Administered Medications  Medication Dose Route Frequency Provider Last Rate Last Dose  . acetaminophen (TYLENOL) tablet 650 mg  650 mg Oral Q6H PRN Niel Hummer, NP      . alum & mag hydroxide-simeth (MAALOX/MYLANTA) 200-200-20 MG/5ML suspension 30 mL  30 mL Oral Q4H PRN Niel Hummer, NP      . ARIPiprazole (ABILIFY) tablet 15 mg  15 mg Oral Daily Nicholaus Bloom, MD   15 mg at  02/07/15 3536  . citalopram (CELEXA) tablet 40 mg  40 mg Oral Daily Nicholaus Bloom, MD   40 mg at 02/07/15 [phone]  . clonazePAM (KLONOPIN) tablet 1 mg  1 mg Oral BID PRN Nicholaus Bloom, MD   1 mg at 02/07/15 1552  . divalproex (DEPAKOTE ER) 24 hr tablet 750 mg  750 mg Oral Daily Nicholaus Bloom, MD   750 mg at 02/07/15 1540  . feeding supplement (ENSURE ENLIVE) (ENSURE ENLIVE) liquid 237 mL  237 mL Oral BID BM Nicholaus Bloom, MD   237 mL at 02/07/15 1147  . hydrOXYzine (ATARAX/VISTARIL) tablet 25 mg  25 mg Oral TID PRN Niel Hummer, NP      . magnesium hydroxide (MILK OF MAGNESIA) suspension 30 mL  30 mL Oral Daily PRN Niel Hummer, NP      . mirtazapine (REMERON) tablet 15 mg  15 mg Oral QHS Nicholaus Bloom, MD   15 mg at 02/06/15 2044  .  naproxen (NAPROSYN) tablet 500 mg  500 mg Oral BID PRN Niel Hummer, NP      . nicotine (NICODERM CQ - dosed in mg/24 hours) patch 21 mg  21 mg Transdermal Daily Nicholaus Bloom, MD   21 mg at 02/07/15 0834  . pantoprazole (PROTONIX) EC tablet 40 mg  40 mg Oral Daily Niel Hummer, NP   40 mg at 02/07/15 0867  . prazosin (MINIPRESS) capsule 2 mg  2 mg Oral QHS Nicholaus Bloom, MD   2 mg at 02/06/15 2044  . pregabalin (LYRICA) capsule 75 mg  75 mg Oral TID Nicholaus Bloom, MD   75 mg at 02/07/15 1636  . traZODone (DESYREL) tablet 100 mg  100 mg Oral QHS Niel Hummer, NP   100 mg at 02/06/15 2044    Lab Results:  Results for orders placed or performed during the hospital encounter of 02/05/15 (from the past 48 hour(s))  Comprehensive metabolic panel     Status: Abnormal   Collection Time: 02/05/15  6:35 PM  Result Value Ref Range   Sodium 137 135 - 145 mmol/L   Potassium 4.1 3.5 - 5.1 mmol/L   Chloride 102 101 - 111 mmol/L   CO2 26 22 - 32 mmol/L   Glucose, Bld 101 (H) 65 - 99 mg/dL   BUN 15 6 - 20 mg/dL   Creatinine, Ser 1.08 0.61 - 1.24 mg/dL   Calcium 9.3 8.9 - 10.3 mg/dL   Total Protein 7.3 6.5 - 8.1 g/dL   Albumin 4.4 3.5 - 5.0 g/dL   AST 22 15 - 41 U/L   ALT 17 17 - 63 U/L   Alkaline Phosphatase 45 38 - 126 U/L   Total Bilirubin 0.4 0.3 - 1.2 mg/dL   GFR calc non Af Amer >60 >60 mL/min   GFR calc Af Amer >60 >60 mL/min    Comment: (NOTE) The eGFR has been calculated using the CKD EPI equation. This calculation has not been validated in all clinical situations. eGFR's persistently <60 mL/min signify possible Chronic Kidney Disease.    Anion gap 9 5 - 15  CBC     Status: Abnormal   Collection Time: 02/05/15  6:35 PM  Result Value Ref Range   WBC 13.4 (H) 4.0 - 10.5 K/uL   RBC 5.50 4.22 - 5.81 MIL/uL   Hemoglobin 16.7 13.0 - 17.0 g/dL   HCT 49.0 39.0 - 52.0 %   MCV 89.1 78.0 -  100.0 fL   MCH 30.4 26.0 - 34.0 pg   MCHC 34.1 30.0 - 36.0 g/dL   RDW 13.2 11.5 - 15.5 %    Platelets 229 150 - 400 K/uL  Urinalysis, Routine w reflex microscopic (not at [facility])     Status: None   Collection Time: 02/06/15 12:13 AM  Result Value Ref Range   Color, Urine YELLOW YELLOW   APPearance CLEAR CLEAR   Specific Gravity, Urine 1.024 1.005 - 1.030   pH 6.0 5.0 - 8.0   Glucose, UA NEGATIVE NEGATIVE mg/dL   Hgb urine dipstick NEGATIVE NEGATIVE   Bilirubin Urine NEGATIVE NEGATIVE   Ketones, ur NEGATIVE NEGATIVE mg/dL   Protein, ur NEGATIVE NEGATIVE mg/dL   Urobilinogen, UA 0.2 0.0 - 1.0 mg/dL   Nitrite NEGATIVE NEGATIVE   Leukocytes, UA NEGATIVE NEGATIVE    Comment: MICROSCOPIC NOT DONE ON URINES WITH NEGATIVE PROTEIN, BLOOD, LEUKOCYTES, NITRITE, OR GLUCOSE <1000 mg/dL.  Urine rapid drug screen (hosp performed)     Status: None   Collection Time: 02/06/15 12:13 AM  Result Value Ref Range   Opiates NONE DETECTED NONE DETECTED   Cocaine NONE DETECTED NONE DETECTED   Benzodiazepines NONE DETECTED NONE DETECTED   Amphetamines NONE DETECTED NONE DETECTED   Tetrahydrocannabinol NONE DETECTED NONE DETECTED   Barbiturates NONE DETECTED NONE DETECTED    Comment:        DRUG SCREEN FOR MEDICAL PURPOSES ONLY.  IF CONFIRMATION IS NEEDED FOR ANY PURPOSE, NOTIFY LAB WITHIN 5 DAYS.        LOWEST DETECTABLE LIMITS FOR URINE DRUG SCREEN Drug Class       Cutoff (ng/mL) Amphetamine      1000 Barbiturate      200 Benzodiazepine   062 Tricyclics       376 Opiates          300 Cocaine          300 THC              50     Physical Findings: AIMS: Facial and Oral Movements Muscles of Facial Expression: None, normal Lips and Perioral Area: None, normal Jaw: None, normal Tongue: None, normal,Extremity Movements Upper (arms, wrists, hands, fingers): None, normal Lower (legs, knees, ankles, toes): None, normal, Trunk Movements Neck, shoulders, hips: None, normal, Overall Severity Severity of abnormal movements (highest score from questions above): None, normal Incapacitation  due to abnormal movements: None, normal Patient's awareness of abnormal movements (rate only patient's report): No Awareness, Dental Status Current problems with teeth and/or dentures?: No Does patient usually wear dentures?: No  CIWA:    COWS:  COWS Total Score: 2  Musculoskeletal: Strength & Muscle Tone: within normal limits Gait & Station: normal Patient leans: normal  Psychiatric Specialty Exam: Review of Systems  Constitutional: Negative.   HENT: Negative.   Eyes: Negative.   Respiratory: Negative.   Cardiovascular: Negative.   Gastrointestinal: Negative.   Genitourinary: Negative.   Musculoskeletal: Positive for back pain.  Skin: Negative.   Neurological: Negative.   Endo/Heme/Allergies: Negative.   Psychiatric/Behavioral: Positive for depression. The patient is nervous/anxious.     Blood pressure 114/73, pulse 92, temperature 98.3 F (36.8 C), temperature source Oral, resp. rate 18, height _0  (1.676 m), weight 73.936 kg (163 lb), SpO2 98 %.Body mass index is 26.32 kg/(m^2).  General Appearance: Fairly Groomed  Engineer, water::  Fair  Speech:  Clear and Coherent  Volume:  Normal  Mood:  Anxious and worried  Affect:  anxious worried  Thought Process:  Coherent and Goal Directed  Orientation:  Full (Time, Place, and Person)  Thought Content:  symptoms events worries concerns  Suicidal Thoughts:  No  Homicidal Thoughts:  No  Memory:  Immediate;   Fair Recent;   Fair Remote;   Fair  Judgement:  Fair  Insight:  Present  Psychomotor Activity:  Restlessness  Concentration:  Fair  Recall:  [employer] [place]  Language: Fair  Akathisia:  No  Handed:  Right  AIMS (if indicated):     Assets:  Desire for Improvement Housing Social Support  ADL's:  Intact  Cognition: WNL  Sleep:  Number of Hours: 6.75   Treatment Plan Summary: Daily contact with patient to assess and evaluate symptoms and progress in treatment and Medication management Supportive  approach/coping skills Depression; will continue Celexa 40 mg with Abilify augmentation Anxiety; will continue the klonopin 1 mg BID PRN Mood instability; will continue the Depakote ER 750 mg Nightmares; will continue the Prazosin 2 mg HS Pain; will resume his Lyrica 75 mg TID Work with CBT/mindfulness [place]ington Devine A 02/07/2015, 6:29 PM

## 2015-02-07 NOTE — [facility] Group Notes (Signed)
[facility] LCSW Group Therapy  02/07/2015 12:30 PM  Type of Therapy:  Group Therapy  Participation Level:  Active  Participation Quality:  Attentive  Affect:  Appropriate  Cognitive:  Alert  Insight:  Improving  Engagement in Therapy:  Improving  Modes of Intervention:  Confrontation, Discussion, Education, Exploration, Problem-solving, Rapport Building, Socialization and Support  Summary of Progress/Problems: MHA Speaker came to talk about his personal journey with substance abuse and addiction. The pt processed ways by which to relate to the speaker. MHA speaker provided handouts and educational information pertaining to groups and services offered by the [facility].    Smart, Ricardo Sims LCSWA  02/07/2015, 12:30 PM

## 2015-02-07 NOTE — [facility] Group Notes (Signed)
The focus of this group is to educate the patient on the purpose and policies of crisis stabilization and provide a format to answer questions about their admission.  The group details unit policies and expectations of patients while admitted.  Patient did not attend 0900 nurse education orientation group this morning.  Patient stayed in bed.   
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
## 2015-02-07 NOTE — Progress Notes (Signed)
D: Patient continues to be anxious.  Has requested his clonopin twice daily.  He is pleasant upon approach.  He rates his depression as a 4; hopelessness as a 2 and anxiety as a 6.  He is attending groups.  His plan is to "set up a follow up goal for my meds.  Talk to anyone who can and will be able to help."  Patient is also requesting an order for his lyrica which he does take at home.  He denies SI/HI/AVH. A: Continue to monitor medication management and MD orders.  Safety checks completed every 15 minutes per protocol.  Offer support and encouragement as needed. R: Patient's behavior is appropriate.
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
## 2015-02-07 NOTE — Progress Notes (Signed)
Adult Psychoeducational Group Note  Date:  02/07/2015 Time:  9:21 PM  Group Topic/Focus:  Wrap-Up Group:   The focus of this group is to help patients review their daily goal of treatment and discuss progress on daily workbooks.  Participation Level:  Active  Participation Quality:  Appropriate  Affect:  Appropriate  Cognitive:  Appropriate  Insight: Appropriate  Engagement in Group:  Engaged  Modes of Intervention:  Discussion  Additional Comments:  Pt goal was to be D/C tomorrow and to get his medicines straight. Pt goal has been accomplished and pt will be D/C tomorrow.  Casilda Carls 02/07/2015, 9:21 PM

## 2015-02-07 NOTE — Progress Notes (Signed)
Recreation Therapy Notes  Animal-Assisted Activity (AAA) Program Checklist/Progress Notes Patient Eligibility Criteria Checklist & Daily Group note for Rec Tx Intervention  Date: 10.04.2016 Time: 2:45pm Location: [address]    AAA/T Program Assumption of Risk Form signed by Patient/ or Parent Legal Guardian yes  Patient is free of allergies or sever asthma yes  Patient reports no fear of animals yes  Patient reports no history of cruelty to animals yes  Patient understands his/her participation is voluntary yes  Patient washes hands before animal contact yes  Patient washes hands after animal contact yes  Behavioral Response: Attentive  Education: Hand Washing, Appropriate Animal Interaction   Education Outcome: Acknowledges education.   Clinical Observations/Feedback: Patient actively engaged in session, petting therapy dog appropriately and interacting with peers appropriately.   Ricardo Sims, LRT/CTRS  Aminat Shelburne L 02/07/2015 3:07 PM

## 2015-02-08 MED ORDER — GABAPENTIN 300 MG PO CAPS
300.0000 mg | ORAL_CAPSULE | Freq: Three times a day (TID) | ORAL | Status: DC
  Administered 2015-02-08: 300 mg via ORAL
  Filled 2015-02-08: qty 21
  Filled 2015-02-08: qty 1
  Filled 2015-02-08: qty 21
  Filled 2015-02-08: qty 1
  Filled 2015-02-08: qty 21
  Filled 2015-02-08 (×2): qty 1

## 2015-02-08 MED ORDER — CLONAZEPAM 1 MG PO TABS: 1.0000 mg | ORAL_TABLET | Freq: Two times a day (BID) | ORAL | Status: DC | PRN

## 2015-02-08 MED ORDER — MIRTAZAPINE 15 MG PO TABS: 15.0000 mg | ORAL_TABLET | Freq: Every day | ORAL | Status: DC

## 2015-02-08 MED ORDER — ARIPIPRAZOLE 15 MG PO TABS: 15.0000 mg | ORAL_TABLET | Freq: Every day | ORAL | Status: DC

## 2015-02-08 MED ORDER — GABAPENTIN 300 MG PO CAPS: 300.0000 mg | ORAL_CAPSULE | Freq: Three times a day (TID) | ORAL | Status: DC

## 2015-02-08 MED ORDER — NICOTINE 21 MG/24HR TD PT24: 21.0000 mg | MEDICATED_PATCH | Freq: Every day | TRANSDERMAL | Status: DC

## 2015-02-08 MED ORDER — TRAZODONE HCL 100 MG PO TABS: 100.0000 mg | ORAL_TABLET | Freq: Every day | ORAL | Status: DC

## 2015-02-08 MED ORDER — NAPROXEN 500 MG PO TABS: 500.0000 mg | ORAL_TABLET | Freq: Two times a day (BID) | ORAL | Status: DC

## 2015-02-08 MED ORDER — CITALOPRAM HYDROBROMIDE 40 MG PO TABS: 40.0000 mg | ORAL_TABLET | Freq: Every day | ORAL | Status: DC

## 2015-02-08 MED ORDER — PRAZOSIN HCL 2 MG PO CAPS: 2.0000 mg | ORAL_CAPSULE | Freq: Every day | ORAL | Status: DC

## 2015-02-08 MED ORDER — HYDROXYZINE HCL 25 MG PO TABS: 25.0000 mg | ORAL_TABLET | Freq: Three times a day (TID) | ORAL | Status: DC | PRN

## 2015-02-08 MED ORDER — OMEPRAZOLE 40 MG PO CPDR: 40.0000 mg | DELAYED_RELEASE_CAPSULE | Freq: Every day | ORAL | Status: DC

## 2015-02-08 MED ORDER — DIVALPROEX SODIUM ER 250 MG PO TB24: 750.0000 mg | ORAL_TABLET | Freq: Every day | ORAL | Status: DC

## 2015-02-08 MED ORDER — PREGABALIN 75 MG PO CAPS: 75.0000 mg | ORAL_CAPSULE | Freq: Three times a day (TID) | ORAL | Status: DC

## 2015-02-08 NOTE — Progress Notes (Signed)
Pt reports he is doing well, and he is glad to be back on his medications.  He reports that his family is supportive and that he is no longer having suicidal thoughts.  He denies HI/AVH.  He has been in the dayroom this evening watching TV with some interaction with his peers.  He stated that he was going to start a new job in a few days and would soon have the insurance he needed to stay on his medications.  He said that the CSW had referred him to [facility] for med management until then.  Pt makes his needs known to staff.  He is pleasant and cooperative on the unit.  He has attended groups today.  Support and encouragement offered.  Safety maintained with q15 minute checks.

## 2015-02-08 NOTE — B[facility] Suicide Risk Assessment (Signed)
[facility] INPATIENT:  Family/Significant Other Suicide Prevention Education  Suicide Prevention Education:  Contact Attempts: Atif Chapple (pt's wife) [phone] has been identified by the patient as the family member/significant other with whom the patient will be residing, and identified as the person(s) who will aid the patient in the event of a mental health crisis.  With written consent from the patient, two attempts were made to provide suicide prevention education, prior to and/or following the patient's discharge.  We were unsuccessful in providing suicide prevention education.  A suicide education pamphlet was given to the patient to share with family/significant other.  Date and time of first attempt: 02/07/15 at 4:30PM (call went to voicemail-full) Date and time of second attempt: 02/08/15 at 10:00AM (call went to voicemail-full) Pt has no other number for wife.   Smart, Merit Gadsby LCSWA  02/08/2015, 10:02 AM

## 2015-02-08 NOTE — BHH Group Notes (Signed)
[facility] LCSW Aftercare Discharge Planning Group Note   02/08/2015 10:20 AM  Participation Quality:  Invited/chose to remain in bed. DID NOT ATTEND.  Smart, [employer]

## 2015-02-08 NOTE — BHH Suicide Risk Assessment (Signed)
[facility] Discharge Suicide Risk Assessment   Demographic Factors:  Male and Caucasian  Total Time spent with patient: 30 minutes  Musculoskeletal: Strength & Muscle Tone: within normal limits Gait & Station: normal Patient leans: normal  Psychiatric Specialty Exam: Physical Exam  Review of Systems  Constitutional: Negative.   HENT: Negative.   Eyes: Negative.   Respiratory: Negative.   Cardiovascular: Negative.   Gastrointestinal: Negative.   Genitourinary: Negative.   Musculoskeletal: Positive for back pain.  Skin: Negative.   Neurological: Negative.   Endo/Heme/Allergies: Negative.   Psychiatric/Behavioral: Positive for depression.    Blood pressure 115/66, pulse 132, temperature 98.7 F (37.1 C), temperature source Oral, resp. rate 18, height  (1.676 m), weight 73.936 kg (163 lb), SpO2 98 %.Body mass index is 26.32 kg/(m^2).  General Appearance: Fairly Groomed  Patent attorney::  Fair  Speech:  Clear and Coherent409  Volume:  Normal  Mood:  Euthymic  Affect:  Appropriate  Thought Process:  Coherent and Goal Directed  Orientation:  Full (Time, Place, and Person)  Thought Content:  plans as he moves on  Suicidal Thoughts:  No  Homicidal Thoughts:  No  Memory:  Immediate;   Fair Recent;   Fair Remote;   Fair  Judgement:  Fair  Insight:  Present  Psychomotor Activity:  Normal  Concentration:  Fair  Recall:  [employer] of Knowledge:Fair  Language: Fair  Akathisia:  No  Handed:  Right  AIMS (if indicated):     Assets:  Desire for Improvement Housing Social Support  Sleep:  Number of Hours: 6.75  Cognition: WNL  ADL's:  Intact   Have you used any form of tobacco in the last 30 days? (Cigarettes, Smokeless Tobacco, Cigars, and/or Pipes): Yes  Has this patient used any form of tobacco in the last 30 days? (Cigarettes, Smokeless Tobacco, Cigars, and/or Pipes) Yes, A prescription for an FDA-approved tobacco cessation medication was offered at discharge and the  patient refused  Mental Status Per Nursing Assessment::   On Admission:     Current Mental Status by Physician: In full contact with reality. There are no active SI plans or intent. He states as he is back on his medications he is starting to feel better. He wants to leave today and go to [facility] tomorrow get establish with them so he can continue to get the medications prescribed. He has a new job starting Monday. He is looking forward to getting his life back together   Loss Factors: Decline in physical health  Historical Factors: NA  Risk Reduction Factors:   Sense of responsibility to family, Employed, Living with another person, especially a relative and Positive social support  Continued Clinical Symptoms:  Severe Anxiety and/or Agitation Depression:   Severe  Cognitive Features That Contribute To Risk:  None    Suicide Risk:  Minimal: No identifiable suicidal ideation.  Patients presenting with no risk factors but with morbid ruminations; may be classified as minimal risk based on the severity of the depressive symptoms  Principal Problem: MDD (major depressive disorder), recurrent severe, without psychosis [facility]) Discharge Diagnoses:  Patient Active Problem List   Diagnosis Date Noted  . Depression with suicidal ideation [F32.9, R45.851] 02/06/2015  . Polysubstance abuse [F19.10] 12/21/2014  . Substance induced mood disorder (HCC) [F19.94] 12/21/2014  . PTSD (post-traumatic stress disorder) [F43.10] 12/21/2014  . MDD (major depressive disorder), recurrent severe, without psychosis (HCC) [F33.2] 12/21/2014    Follow-up Information    Follow up with [facility] On 02/10/2015.   Why:  Appt at 8:00AM for medication management/hospital follow-up/assessment for counseling services. Please bring photo ID.    Contact information:   [address] [place], [place] [address] Phone: [phone] Fax: [phone]      Plan Of Care/Follow-up recommendations:   Activity:  as tolerated Diet:  regular Follow up [facility] as above Is patient on multiple antipsychotic therapies at discharge:  No   Has Patient had three or more failed trials of antipsychotic monotherapy by history:  No  Recommended Plan for Multiple Antipsychotic Therapies: NA    Kurk Corniel A 02/08/2015, 3:34 PM
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
## 2015-02-08 NOTE — Progress Notes (Signed)
Recreation Therapy Notes   Date: 10.05.2016 Time: 9:30am Location: [address] Group Room   Group Topic: Stress Management  Goal Area(s) Addresses:  Patient will actively participate in stress management techniques presented during session.   Behavioral Response: Did not attend.   Chenay Nesmith L Zena Vitelli, LRT/CTRS        Jaylina Ramdass L 02/08/2015 1:05 PM 

## 2015-02-08 NOTE — Progress Notes (Signed)
  [facility] Adult Case Management Discharge Plan :  Will you be returning to the same living situation after discharge:  Yes,  home with wife At discharge, do you have transportation home?: Yes,  family member Do you have the ability to pay for your medications: Yes,  mental health  Release of information consent forms completed and submitted to medical records by CSW. Patient to Follow up at: Follow-up Information    Follow up with [facility] On 02/10/2015.   Why:  Appt at 8:00AM for medication management/hospital follow-up/assessment for counseling services. Please bring photo ID.    Contact information:   [address] [place], [place] [address] Phone: [phone] Fax: [phone]      Patient denies SI/HI: Yes,  during group/self report.     Safety Planning and Suicide Prevention discussed: Yes,  Contact attempts made with pt's wife. SPE completed with pt. SPI pamphlet and mobile crisis information provided to pt and he was encouraged to share information with support network, ask questions, and talk about any concerns relating to SPE.  Have you used any form of tobacco in the last 30 days? (Cigarettes, Smokeless Tobacco, Cigars, and/or Pipes): Yes  Has patient been referred to the Quitline?: Patient refused referral  Smart, Lebron Quam  02/08/2015, 10:03 AM

## 2015-02-08 NOTE — Discharge Summary (Signed)
Physician Discharge Summary Note  Patient:  Ricardo Sims is an 34 y.o., male MRN:  [id number] DOB:  [birth year] Patient phone:  [phone] (home)  Patient address:   [place] [address],  Total Time spent with patient: Greater than 30 minutes  Date of Admission:  02/06/2015  Date of Discharge: 02-08-15  Reason for Admission: Worsening symptoms of depression  Principal Problem: MDD (major depressive disorder), recurrent severe, without psychosis [facility]) Discharge Diagnoses: Patient Active Problem List   Diagnosis Date Noted  . Depression with suicidal ideation [F32.9, R45.851] 02/06/2015  . Polysubstance abuse [F19.10] 12/21/2014  . Substance induced mood disorder ([place]) [F19.94] 12/21/2014  . PTSD (post-traumatic stress disorder) [F43.10] 12/21/2014  . MDD (major depressive disorder), recurrent severe, without psychosis ([place]) [F33.2] 12/21/2014   Musculoskeletal: Strength & Muscle Tone: within normal limits Gait & Station: normal Patient leans: N/A  Psychiatric Specialty Exam: Physical Exam  Psychiatric: His speech is normal and behavior is normal. Judgment and thought content normal. His mood appears not anxious. His affect is not angry, not blunt, not labile and not inappropriate. Cognition and memory are normal. He does not exhibit a depressed mood.    Review of Systems  Constitutional: Negative.   HENT: Negative.   Eyes: Negative.   Respiratory: Negative.   Cardiovascular: Negative.   Gastrointestinal: Negative.   Genitourinary: Negative.   Musculoskeletal: Negative.   Skin: Negative.   Neurological: Negative.   Endo/Heme/Allergies: Negative.   Psychiatric/Behavioral: Positive for depression (Stable) and substance abuse (Hx polysubstance dependence). Negative for suicidal ideas, hallucinations and memory loss. The patient has insomnia (Stable). The patient is not nervous/anxious.     Blood pressure 115/66, pulse 132, temperature 98.7 F (37.1 C),  temperature source Oral, resp. rate 18, height 5' 6"  (1.676 m), weight 73.936 kg (163 lb), SpO2 98 %.Body mass index is 26.32 kg/(m^2).  See Md's SRA   Have you used any form of tobacco in the last 30 days? (Cigarettes, Smokeless Tobacco, Cigars, and/or Pipes): Yes  Has this patient used any form of tobacco in the last 30 days? (Cigarettes, Smokeless Tobacco, Cigars, and/or Pipes) Yes, A prescription for an FDA-approved tobacco cessation medication was offered at discharge and the patient refused  Past Medical History:  Past Medical History  Diagnosis Date  . Rib fracture   . Orbital fracture ([place])   . Depression   . Anxiety   . Kidney stone   . PTSD (post-traumatic stress disorder)   . Nephrolithiasis     Past Surgical History  Procedure Laterality Date  . Lithotripsy    . Lithotripsy    . Renal artery stent     Family History: History reviewed. No pertinent family history.  Social History:  History  Alcohol Use No     History  Drug Use No    Social History   Social History  . Marital Status: Married    Spouse Name: N/A  . Number of Children: N/A  . Years of Education: N/A   Social History Main Topics  . Smoking status: Current Every Day Smoker -- 0.50 packs/day  . Smokeless tobacco: None  . Alcohol Use: No  . Drug Use: No  . Sexual Activity: Not Asked   Other Topics Concern  . None   Social History Narrative   Risk to Self: Is patient at risk for suicide?: Yes What has been your use of drugs/alcohol within the last 12 months?: hx of marijjuana use after death of pt's first wife.  hx of alcohol abuse. no substance about in about 4-5 years. 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 When he  realized what was going on he had already ran out of medications. States he has been back and forth [employer]. States off his medications his depression his anxiety got worst. States he was dealing with the stress of the financial situation. States he was feeling depressesd anxious with thoughts of suicide of hanging himself. Michela Pitcher he said he would rather be dead than feeling the way he was feeling. . States his first wife killed herself in front of him. 3 days after thanksgiving. States he still has flashbacks. States he is having racing thought playing several scenarios in his head and that is one of them.  Upon his arrival & admision to the adult unit, Ricardo Sims was evaluated & his presenting symptoms identified. The medication management for the presenting symptoms were discussed & initiated targeting those symptoms. He was enrolled in the group counseling sessions & encouraged to participate in the unit programming. His other pre-existing medical problems were identified & his home medications restarted accordingly. Ricardo Sims was evaluated on daily basis by the clinical providers to assure his response to the treatment regimen.As his treatment progressed,  improvement was noted as evidenced by his report of decreasing symptoms, improved sleep, mood, affect, medication tolerance & active participation in the unit programming. He was encouraged to update his providers on his progress by daily completion of a self inventory assessment, noting mood, mental status, pain, any new symptoms, anxiety and or concerns.  Ricardo Sims's symptoms responded well to his treatment regimen combined with a therapeutic and supportive environment. He was motivated for recovery as evidenced by a positive/appropriate behavior and his interaction with the staff & fellow patients.He also worked closely with the treatment team and case manager to develop a discharge plan with appropriate goals to maintain mood stability after  discharge. Coping skills, problem solving as well as relaxation therapies were also part of the unit programming.  Upon his hospital discharge, Ricardo Sims was in much improved condition than upon admission.His symptoms were reported as significantly decreased or resolved completely. He adamantly denies any SIHI,  AVH, delusional thoughts & or paranoia. He was motivated to continue taking medication with a goal of continued improvement in mental health. He will continue psychiatric care on an outpatient basis as noted below. He is provided with all the necessary information required to make this appointment without problems. Ricardo Sims received a 7 days worth, supply samples of his [facility] discharge medications. He left [facility] with all personal belongings in no apparent distress. Transportation per family.  Consults:  psychiatry  Significant Diagnostic Studies:  labs: CBC with diff, CMP, UDS, toxicology tests, U/A, results reviewed, stable  Discharge Vitals:   Blood pressure 115/66, pulse 132, temperature 98.7 F (37.1 C), temperature source Oral, resp. rate 18, height 5' 6"  (1.676 m), weight 73.936 kg (163 lb), SpO2 98 %. Body mass index is 26.32 kg/(m^2). Lab Results:   Results for orders placed or performed during the hospital encounter of 02/05/15 (from the past 72 hour(s))  Comprehensive metabolic panel     Status: Abnormal   Collection Time: 02/05/15  6:35 PM  Result Value Ref Range   Sodium 137 135 - 145 mmol/L   Potassium 4.1 3.5 - 5.1 mmol/L   Chloride 102 101 - 111 mmol/L   CO2 26 22 - 32 mmol/L   Glucose, Bld 101 (H) 65 - 99 mg/dL   BUN 15 6 - 20 mg/dL   Creatinine, Ser 1.08 0.61 - 1.24 mg/dL   Calcium 9.3 8.9 - 10.3 mg/dL   Total Protein 7.3 6.5 - 8.1 g/dL   Albumin 4.4 3.5 - 5.0 g/dL   AST 22 15 - 41 U/L   ALT 17 17 - 63 U/L   Alkaline Phosphatase 45 38 - 126 U/L   Total Bilirubin 0.4 0.3 - 1.2 mg/dL   GFR calc non Af Amer >60 >60 mL/min   GFR calc Af Amer >60 >60 mL/min    Comment:  (NOTE) The eGFR has been calculated using the CKD EPI equation. This calculation has not been validated in all clinical situations. eGFR's persistently <60 mL/min signify possible Chronic Kidney Disease.    Anion gap 9 5 - 15  CBC     Status: Abnormal   Collection Time: 02/05/15  6:35 PM  Result Value Ref Range   WBC 13.4 (H) 4.0 - 10.5 K/uL   RBC 5.50 4.22 - 5.81 MIL/uL   Hemoglobin 16.7 13.0 - 17.0 g/dL   HCT 49.0 39.0 - 52.0 %   MCV 89.1 78.0 - 100.0 fL   MCH 30.4 26.0 - 34.0 pg   MCHC 34.1 30.0 - 36.0 g/dL   RDW 13.2 11.5 - 15.5 %   Platelets 229 150 - 400 K/uL  Urinalysis, Routine w reflex microscopic (not at [facility])     Status: None   Collection Time: 02/06/15 12:13 AM  Result Value Ref Range   Color, Urine YELLOW YELLOW   APPearance CLEAR CLEAR   Specific Gravity, Urine 1.024 1.005 - 1.030   pH 6.0 5.0 - 8.0   Glucose, UA NEGATIVE NEGATIVE mg/dL   Hgb urine dipstick NEGATIVE NEGATIVE   Bilirubin Urine NEGATIVE NEGATIVE   Ketones, ur NEGATIVE NEGATIVE mg/dL   Protein, ur NEGATIVE NEGATIVE mg/dL   Urobilinogen, UA 0.2 0.0 - 1.0 mg/dL   Nitrite NEGATIVE NEGATIVE   Leukocytes, UA NEGATIVE NEGATIVE    Comment: MICROSCOPIC NOT DONE ON URINES WITH NEGATIVE PROTEIN, BLOOD, LEUKOCYTES, NITRITE, OR GLUCOSE <1000 mg/dL.  Urine rapid drug screen (hosp performed)     Status: None   Collection Time: 02/06/15 12:13 AM  Result Value Ref Range   Opiates NONE DETECTED NONE DETECTED   Cocaine NONE DETECTED NONE DETECTED   Benzodiazepines NONE DETECTED NONE DETECTED   Amphetamines NONE DETECTED NONE DETECTED   Tetrahydrocannabinol NONE DETECTED NONE DETECTED   Barbiturates NONE DETECTED NONE DETECTED    Comment:        DRUG SCREEN FOR MEDICAL PURPOSES ONLY.  IF CONFIRMATION IS NEEDED FOR ANY PURPOSE, NOTIFY LAB WITHIN 5 DAYS.        LOWEST DETECTABLE LIMITS FOR URINE DRUG SCREEN Drug Class       Cutoff (ng/mL) Amphetamine      1000 Barbiturate      200 Benzodiazepine    419 Tricyclics       379 Opiates          300 Cocaine          300 THC              50    Physical Findings: AIMS: Facial and Oral  Movements Muscles of Facial Expression: None, normal Lips and Perioral Area: None, normal Jaw: None, normal Tongue: None, normal,Extremity Movements Upper (arms, wrists, hands, fingers): None, normal Lower (legs, knees, ankles, toes): None, normal, Trunk Movements Neck, shoulders, hips: None, normal, Overall Severity Severity of abnormal movements (highest score from questions above): None, normal Incapacitation due to abnormal movements: None, normal Patient's awareness of abnormal movements (rate only patient's report): No Awareness, Dental Status Current problems with teeth and/or dentures?: No Does patient usually wear dentures?: No  CIWA:    COWS:  COWS Total Score: 2  See Psychiatric Specialty Exam and Suicide Risk Assessment completed by Attending Physician prior to discharge.  Discharge destination:  Home  Is patient on multiple antipsychotic therapies at discharge:  No   Has Patient had three or more failed trials of antipsychotic monotherapy by history:  No  Recommended Plan for Multiple Antipsychotic Therapies: NA    Medication List    STOP taking these medications        HYDROcodone-acetaminophen 5-325 MG tablet  Commonly known as:  NORCO/VICODIN     ondansetron 4 MG tablet  Commonly known as:  ZOFRAN      TAKE these medications      Indication   ARIPiprazole 15 MG tablet  Commonly known as:  ABILIFY  Take 1 tablet (15 mg total) by mouth daily. For mood control   Indication:  Mood control     citalopram 40 MG tablet  Commonly known as:  CELEXA  Take 1 tablet (40 mg total) by mouth daily. For depression   Indication:  Depression     clonazePAM 1 MG tablet  Commonly known as:  KLONOPIN  Take 1 tablet (1 mg total) by mouth 2 (two) times daily as needed (anxiety).   Indication:  Anxiety     divalproex 250 MG 24 hr  tablet  Commonly known as:  DEPAKOTE ER  Take 3 tablets (750 mg total) by mouth daily. For mood stabilization   Indication:  Mood stabilization     gabapentin 300 MG capsule  Commonly known as:  NEURONTIN  Take 1 capsule (300 mg total) by mouth 3 (three) times daily. For pain/agitation   Indication:  Agitation, Pain     hydrOXYzine 25 MG tablet  Commonly known as:  ATARAX/VISTARIL  Take 1 tablet (25 mg total) by mouth 3 (three) times daily as needed for anxiety.   Indication:  Anxiety     mirtazapine 15 MG tablet  Commonly known as:  REMERON  Take 1 tablet (15 mg total) by mouth at bedtime. For depression/insomnia   Indication:  Trouble Sleeping, Major Depressive Disorder     naproxen 500 MG tablet  Commonly known as:  NAPROSYN  Take 1 tablet (500 mg total) by mouth 2 (two) times daily. (Use PRN): For pain   Indication:  Pain     nicotine 21 mg/24hr patch  Commonly known as:  NICODERM CQ - dosed in mg/24 hours  Place 1 patch (21 mg total) onto the skin daily. For smoking cessation   Indication:  Nicotine Addiction     omeprazole 40 MG capsule  Commonly known as:  PRILOSEC  Take 1 capsule (40 mg total) by mouth daily. For acid reflux   Indication:  Gastroesophageal Reflux Disease     prazosin 2 MG capsule  Commonly known as:  MINIPRESS  Take 1 capsule (2 mg total) by mouth at bedtime. For nightmares   Indication:  Nightmares     pregabalin  75 MG capsule  Commonly known as:  LYRICA  Take 1 capsule (75 mg total) by mouth 3 (three) times daily. For pain   Indication:  Neuropathic Pain     traZODone 100 MG tablet  Commonly known as:  DESYREL  Take 1 tablet (100 mg total) by mouth at bedtime. For sleep   Indication:  Trouble Sleeping       Follow-up Information    Follow up with Shawnie Dapper On 02/10/2015.   Why:  Appt at 8:00AM for medication management/hospital follow-up/assessment for counseling services. Please bring photo ID.    Contact information:    [address], [place] [address] Phone: [phone] Fax: [phone]     Follow-up recommendations: Activity:  As tolerated Diet: As recommended by your primary care doctor. Keep all scheduled follow-up appointments as recommended.    Comments: Take all your medications as prescribed by your mental healthcare provider. Report any adverse effects and or reactions from your medicines to your outpatient provider promptly. Patient is instructed and cautioned to not engage in alcohol and or illegal drug use while on prescription medicines. In the event of worsening symptoms, patient is instructed to call the crisis hotline, 911 and or go to the nearest ED for appropriate evaluation and treatment of symptoms. Follow-up with your primary care provider for your other medical issues, concerns and or health care needs.  Total Discharge Time: Greater than 30 minutes  Signed: Encarnacion Slates, PMHNP, FNP-BC 02/08/2015, 11:00 AM  I personally assessed the patient and formulated the plan Geralyn Flash A. Sabra Heck, M.D.

## 2015-02-08 NOTE — Progress Notes (Signed)
Discharge note:  Patient discharged home per MD order.  Patient received all personal belongings, prescriptions and medication samples.  Patient plans to follow up with [facility].  He denies SI/HI/AVH.  Reviewed discharge instructions with patient and he indicated understanding.  Patient left ambulatory with his wife.

## 2015-02-28 ENCOUNTER — Emergency Department (HOSPITAL_COMMUNITY)
Admission: EM | Admit: 2015-02-28 | Discharge: 2015-03-01 | Discharge status: Against Medical Advice | Payer: Self-pay | Attending: Physician Assistant | Admitting: Physician Assistant

## 2015-02-28 ENCOUNTER — Encounter (HOSPITAL_COMMUNITY): Payer: Self-pay

## 2015-02-28 DIAGNOSIS — F329 Major depressive disorder, single episode, unspecified: Secondary | ICD-10-CM | POA: Insufficient documentation

## 2015-02-28 DIAGNOSIS — Z87442 Personal history of urinary calculi: Secondary | ICD-10-CM | POA: Insufficient documentation

## 2015-02-28 DIAGNOSIS — L02412 Cutaneous abscess of left axilla: Secondary | ICD-10-CM | POA: Insufficient documentation

## 2015-02-28 DIAGNOSIS — F419 Anxiety disorder, unspecified: Secondary | ICD-10-CM | POA: Insufficient documentation

## 2015-02-28 DIAGNOSIS — L0291 Cutaneous abscess, unspecified: Secondary | ICD-10-CM

## 2015-02-28 DIAGNOSIS — Z8781 Personal history of (healed) traumatic fracture: Secondary | ICD-10-CM | POA: Insufficient documentation

## 2015-02-28 DIAGNOSIS — R1110 Vomiting, unspecified: Secondary | ICD-10-CM | POA: Insufficient documentation

## 2015-02-28 DIAGNOSIS — Z720 Tobacco use: Secondary | ICD-10-CM | POA: Insufficient documentation

## 2015-02-28 DIAGNOSIS — R531 Weakness: Secondary | ICD-10-CM | POA: Insufficient documentation

## 2015-02-28 DIAGNOSIS — Z79899 Other long term (current) drug therapy: Secondary | ICD-10-CM | POA: Insufficient documentation

## 2015-02-28 MED ORDER — SODIUM CHLORIDE 0.9 % IV BOLUS (SEPSIS): 1000.0000 mL | Freq: Once | INTRAVENOUS | Status: DC

## 2015-02-28 NOTE — ED Provider Notes (Signed)
CSN: [id number]     Arrival date & time 02/28/15  2232 History   First MD Initiated Contact with Patient 02/28/15 2253     Chief Complaint  Patient presents with  . Weakness  . Abscess     (Consider location/radiation/quality/duration/timing/severity/associated sxs/prior Treatment) HPI Comments: Pt comes with 2 complaints. First is with area of redness to the left axilla 2 days ago. He is also c/o generalized weakness today. Had 2 episode of vomiting. No cp,sob, fever, headache,dysuria. He state states that he just doesn't feel right.  He states that he doesn't know how else to explain it. Denies si/hi  The history is provided by the patient. No language interpreter was used.    Past Medical History  Diagnosis Date  . Rib fracture   . Orbital fracture ([facility])   . Depression   . Anxiety   . Kidney stone   . PTSD (post-traumatic stress disorder)   . Nephrolithiasis    Past Surgical History  Procedure Laterality Date  . Lithotripsy    . Lithotripsy    . Renal artery stent     History reviewed. No pertinent family history. Social History  Substance Use Topics  . Smoking status: Current Every Day Smoker -- 1.00 packs/day  . Smokeless tobacco: None  . Alcohol Use: No    Review of Systems  All other systems reviewed and are negative.     Allergies  Review of patient's allergies indicates no known allergies.  Home Medications   Prior to Admission medications   Medication Sig Start Date End Date Taking? Authorizing Provider  ARIPiprazole (ABILIFY) 15 MG tablet Take 1 tablet (15 mg total) by mouth daily. For mood control 02/08/15   Sanjuana Kava, NP  citalopram (CELEXA) 40 MG tablet Take 1 tablet (40 mg total) by mouth daily. For depression 02/08/15   Sanjuana Kava, NP  clonazePAM (KLONOPIN) 1 MG tablet Take 1 tablet (1 mg total) by mouth 2 (two) times daily as needed (anxiety). 02/08/15   Sanjuana Kava, NP  divalproex (DEPAKOTE ER) 250 MG 24 hr tablet Take 3 tablets (750 mg  total) by mouth daily. For mood stabilization 02/08/15   Sanjuana Kava, NP  gabapentin (NEURONTIN) 300 MG capsule Take 1 capsule (300 mg total) by mouth 3 (three) times daily. For pain/agitation 02/08/15   Sanjuana Kava, NP  hydrOXYzine (ATARAX/VISTARIL) 25 MG tablet Take 1 tablet (25 mg total) by mouth 3 (three) times daily as needed for anxiety. 02/08/15   Sanjuana Kava, NP  mirtazapine (REMERON) 15 MG tablet Take 1 tablet (15 mg total) by mouth at bedtime. For depression/insomnia 02/08/15   Sanjuana Kava, NP  naproxen (NAPROSYN) 500 MG tablet Take 1 tablet (500 mg total) by mouth 2 (two) times daily. (Use PRN): For pain 02/08/15   Sanjuana Kava, NP  nicotine (NICODERM CQ - DOSED IN MG/24 HOURS) 21 mg/24hr patch Place 1 patch (21 mg total) onto the skin daily. For smoking cessation 02/08/15   Sanjuana Kava, NP  omeprazole (PRILOSEC) 40 MG capsule Take 1 capsule (40 mg total) by mouth daily. For acid reflux 02/08/15   Sanjuana Kava, NP  prazosin (MINIPRESS) 2 MG capsule Take 1 capsule (2 mg total) by mouth at bedtime. For nightmares 02/08/15   Sanjuana Kava, NP  pregabalin (LYRICA) 75 MG capsule Take 1 capsule (75 mg total) by mouth 3 (three) times daily. For pain 02/08/15   Sanjuana Kava, NP  traZODone (  DESYREL) 100 MG tablet Take 1 tablet (100 mg total) by mouth at bedtime. For sleep 02/08/15   Sanjuana KavaAgnes I Nwoko, NP   BP 110/67 mmHg  Pulse 88  Temp(Src) 98.9 F (37.2 C) (Oral)  Resp 26  SpO2 93% Physical Exam  Constitutional: He is oriented to person, place, and time. He appears well-developed and well-nourished.  Cardiovascular: Normal rate and regular rhythm.   Pulmonary/Chest: Effort normal and breath sounds normal.  Abdominal: Soft. Bowel sounds are normal. There is no tenderness.  Musculoskeletal: Normal range of motion.  Neurological: He is alert and oriented to person, place, and time. Coordination normal.  Skin:  Mild area of redness to the left axilla. No fluctuance or firmness noted  Nursing  note and vitals reviewed.   ED Course  Procedures (including critical care time) Labs Review Labs Reviewed  BASIC METABOLIC PANEL  CBC WITH DIFFERENTIAL/PLATELET  URINALYSIS, ROUTINE W REFLEX MICROSCOPIC ([facility])  URINE RAPID DRUG SCREEN, HOSP PERFORMED    Imaging Review No results found. I have personally reviewed and evaluated these images and lab results as part of my medical decision-making.   EKG Interpretation None      MDM   Final diagnoses:  Abscess  Weakness   Abscess does not need to be drained at this time.  11:56 PM Pt left prior to labs being drawn. Pt pulled his own iv out   Teressa LowerVrinda Annie Roseboom, NP 02/28/15 2356  Courteney Randall AnLyn Mackuen, MD 03/01/15 [id number]

## 2015-02-28 NOTE — ED Notes (Signed)
Per EMS, pt has abscess in left arm pit, redness noted and painful to touch. Pt began having generalized weakness today. NAD

## 2015-03-01 ENCOUNTER — Encounter (HOSPITAL_COMMUNITY): Payer: Self-pay | Admitting: Emergency Medicine

## 2015-03-01 ENCOUNTER — Inpatient Hospital Stay (HOSPITAL_COMMUNITY)
Admission: EM | Admit: 2015-03-01 | Discharge: 2015-03-03 | DRG: 871 | Disposition: A | Discharge status: Home / Self Care | Payer: Self-pay | Attending: Internal Medicine | Admitting: Internal Medicine

## 2015-03-01 DIAGNOSIS — F17200 Nicotine dependence, unspecified, uncomplicated: Secondary | ICD-10-CM | POA: Diagnosis present

## 2015-03-01 DIAGNOSIS — F332 Major depressive disorder, recurrent severe without psychotic features: Secondary | ICD-10-CM | POA: Diagnosis present

## 2015-03-01 DIAGNOSIS — F4310 Post-traumatic stress disorder, unspecified: Secondary | ICD-10-CM | POA: Diagnosis present

## 2015-03-01 DIAGNOSIS — A419 Sepsis, unspecified organism: Principal | ICD-10-CM | POA: Diagnosis present

## 2015-03-01 DIAGNOSIS — D696 Thrombocytopenia, unspecified: Secondary | ICD-10-CM | POA: Diagnosis present

## 2015-03-01 DIAGNOSIS — F419 Anxiety disorder, unspecified: Secondary | ICD-10-CM | POA: Diagnosis present

## 2015-03-01 DIAGNOSIS — G92 Toxic encephalopathy: Secondary | ICD-10-CM | POA: Diagnosis present

## 2015-03-01 DIAGNOSIS — Z79899 Other long term (current) drug therapy: Secondary | ICD-10-CM

## 2015-03-01 DIAGNOSIS — E876 Hypokalemia: Secondary | ICD-10-CM | POA: Diagnosis present

## 2015-03-01 DIAGNOSIS — L0291 Cutaneous abscess, unspecified: Secondary | ICD-10-CM | POA: Insufficient documentation

## 2015-03-01 DIAGNOSIS — L02412 Cutaneous abscess of left axilla: Secondary | ICD-10-CM | POA: Diagnosis present

## 2015-03-01 LAB — COMPREHENSIVE METABOLIC PANEL
ALT: 21 U/L (ref 17–63)
ANION GAP: 9 (ref 5–15)
AST: 29 U/L (ref 15–41)
Albumin: 3.9 g/dL (ref 3.5–5.0)
Alkaline Phosphatase: 42 U/L (ref 38–126)
BUN: 8 mg/dL (ref 6–20)
CHLORIDE: 108 mmol/L (ref 101–111)
CO2: 23 mmol/L (ref 22–32)
CREATININE: 0.88 mg/dL (ref 0.61–1.24)
Calcium: 8.8 mg/dL — ABNORMAL LOW (ref 8.9–10.3)
Glucose, Bld: 133 mg/dL — ABNORMAL HIGH (ref 65–99)
POTASSIUM: 3.2 mmol/L — AB (ref 3.5–5.1)
Sodium: 140 mmol/L (ref 135–145)
Total Bilirubin: 0.6 mg/dL (ref 0.3–1.2)
Total Protein: 7 g/dL (ref 6.5–8.1)

## 2015-03-01 LAB — CBC WITH DIFFERENTIAL/PLATELET
Basophils Absolute: 0 10*3/uL (ref 0.0–0.1)
Basophils Relative: 0 %
EOS ABS: 0 10*3/uL (ref 0.0–0.7)
Eosinophils Relative: 0 %
HCT: 42.6 % (ref 39.0–52.0)
HEMOGLOBIN: 14.7 g/dL (ref 13.0–17.0)
LYMPHS ABS: 0.8 10*3/uL (ref 0.7–4.0)
Lymphocytes Relative: 6 %
MCH: 30.2 pg (ref 26.0–34.0)
MCHC: 34.5 g/dL (ref 30.0–36.0)
MCV: 87.5 fL (ref 78.0–100.0)
Monocytes Absolute: 0.9 10*3/uL (ref 0.1–1.0)
Monocytes Relative: 7 %
NEUTROS PCT: 87 %
Neutro Abs: 11.1 10*3/uL — ABNORMAL HIGH (ref 1.7–7.7)
Platelets: 145 10*3/uL — ABNORMAL LOW (ref 150–400)
RBC: 4.87 MIL/uL (ref 4.22–5.81)
RDW: 13.5 % (ref 11.5–15.5)
WBC: 12.8 10*3/uL — AB (ref 4.0–10.5)

## 2015-03-01 LAB — I-STAT CG4 LACTIC ACID, ED: LACTIC ACID, VENOUS: 2.2 mmol/L — AB (ref 0.5–2.0)

## 2015-03-01 MED ORDER — SODIUM CHLORIDE 0.9 % IV BOLUS (SEPSIS)
500.0000 mL | INTRAVENOUS | Status: AC
  Administered 2015-03-01: 500 mL via INTRAVENOUS

## 2015-03-01 MED ORDER — ONDANSETRON HCL 4 MG/2ML IJ SOLN
4.0000 mg | Freq: Once | INTRAMUSCULAR | Status: AC
  Administered 2015-03-01: 4 mg via INTRAVENOUS
  Filled 2015-03-01: qty 2

## 2015-03-01 MED ORDER — HYDROMORPHONE HCL 1 MG/ML IJ SOLN
1.0000 mg | Freq: Once | INTRAMUSCULAR | Status: AC
  Administered 2015-03-01: 1 mg via INTRAVENOUS
  Filled 2015-03-01: qty 1

## 2015-03-01 MED ORDER — PIPERACILLIN-TAZOBACTAM 3.375 G IVPB 30 MIN
3.3750 g | Freq: Once | INTRAVENOUS | Status: AC
  Administered 2015-03-01: 3.375 g via INTRAVENOUS
  Filled 2015-03-01: qty 50

## 2015-03-01 MED ORDER — SODIUM CHLORIDE 0.9 % IV BOLUS (SEPSIS)
1000.0000 mL | INTRAVENOUS | Status: AC
  Administered 2015-03-01 (×2): 1000 mL via INTRAVENOUS

## 2015-03-01 MED ORDER — VANCOMYCIN HCL IN DEXTROSE 1-5 GM/200ML-% IV SOLN
1000.0000 mg | Freq: Once | INTRAVENOUS | Status: AC
  Administered 2015-03-01: 1000 mg via INTRAVENOUS
  Filled 2015-03-01: qty 200

## 2015-03-01 NOTE — ED Notes (Signed)
Pt noticed an abcess under his left arm yesterday and today its bigger and sore, he's tachycardic and sweaty,

## 2015-03-01 NOTE — ED Provider Notes (Signed)
CSN: [id number]     Arrival date & time 03/01/15  2156 History   First MD Initiated Contact with Patient 03/01/15 2234     Chief Complaint  Patient presents with  . Tachycardia  . Abscess     (Consider location/radiation/quality/duration/timing/severity/associated sxs/prior Treatment) HPI  Ricardo Sims is a(n) 34 y.o. male who presents  Who presents to the ed with CC of left axilla infection. Patient was seen yesterday for the same. The patient states that he has had progressively worsening symptoms since yesterday. He complains of fever, nausea, vomiting, body aches. Upon arrival, patient is febrile and tachycardic, with severe left axillary pain. He denies any IV drug use. He denies recent cat scratch. He has not been taking any antibiotics.  Past Medical History  Diagnosis Date  . Rib fracture   . Orbital fracture (HCC)   . Depression   . Anxiety   . Kidney stone   . PTSD (post-traumatic stress disorder)   . Nephrolithiasis    Past Surgical History  Procedure Laterality Date  . Lithotripsy    . Lithotripsy    . Renal artery stent     History reviewed. No pertinent family history. Social History  Substance Use Topics  . Smoking status: Current Every Day Smoker -- 1.00 packs/day  . Smokeless tobacco: None  . Alcohol Use: No    Review of Systems  Ten systems reviewed and are negative for acute change, except as noted in the HPI.    Allergies  Review of patient's allergies indicates no known allergies.  Home Medications   Prior to Admission medications   Medication Sig Start Date End Date Taking? Authorizing Provider  acetaminophen (TYLENOL) 500 MG tablet Take 500 mg by mouth every 6 (six) hours as needed for mild pain.   Yes Historical Provider, MD  ARIPiprazole (ABILIFY) 15 MG tablet Take 1 tablet (15 mg total) by mouth daily. For mood control 02/08/15  Yes Sanjuana KavaAgnes I Nwoko, NP  citalopram (CELEXA) 40 MG tablet Take 1 tablet (40 mg total) by mouth daily. For  depression 02/08/15  Yes Sanjuana KavaAgnes I Nwoko, NP  clonazePAM (KLONOPIN) 1 MG tablet Take 1 tablet (1 mg total) by mouth 2 (two) times daily as needed (anxiety). 02/08/15  Yes Sanjuana KavaAgnes I Nwoko, NP  divalproex (DEPAKOTE ER) 250 MG 24 hr tablet Take 3 tablets (750 mg total) by mouth daily. For mood stabilization 02/08/15  Yes Sanjuana KavaAgnes I Nwoko, NP  gabapentin (NEURONTIN) 300 MG capsule Take 1 capsule (300 mg total) by mouth 3 (three) times daily. For pain/agitation 02/08/15  Yes Sanjuana KavaAgnes I Nwoko, NP  hydrOXYzine (ATARAX/VISTARIL) 25 MG tablet Take 1 tablet (25 mg total) by mouth 3 (three) times daily as needed for anxiety. 02/08/15  Yes Sanjuana KavaAgnes I Nwoko, NP  ibuprofen (ADVIL,MOTRIN) 200 MG tablet Take 200 mg by mouth every 6 (six) hours as needed for fever.   Yes Historical Provider, MD  mirtazapine (REMERON) 15 MG tablet Take 1 tablet (15 mg total) by mouth at bedtime. For depression/insomnia 02/08/15  Yes Sanjuana KavaAgnes I Nwoko, NP  omeprazole (PRILOSEC) 40 MG capsule Take 1 capsule (40 mg total) by mouth daily. For acid reflux 02/08/15  Yes Sanjuana KavaAgnes I Nwoko, NP  prazosin (MINIPRESS) 2 MG capsule Take 1 capsule (2 mg total) by mouth at bedtime. For nightmares 02/08/15  Yes Sanjuana KavaAgnes I Nwoko, NP  pregabalin (LYRICA) 75 MG capsule Take 1 capsule (75 mg total) by mouth 3 (three) times daily. For pain 02/08/15  Yes Sanjuana KavaAgnes I Nwoko,  NP  traZODone (DESYREL) 100 MG tablet Take 1 tablet (100 mg total) by mouth at bedtime. For sleep 02/08/15  Yes Sanjuana Kava, NP  HYDROcodone-acetaminophen (NORCO/VICODIN) 5-325 MG tablet Take 1 tablet by mouth every 6 (six) hours as needed. pain 02/20/15   Historical Provider, MD  naproxen (NAPROSYN) 500 MG tablet Take 1 tablet (500 mg total) by mouth 2 (two) times daily. (Use PRN): For pain Patient not taking: Reported on 03/01/2015 02/08/15   Sanjuana Kava, NP  nicotine (NICODERM CQ - DOSED IN MG/24 HOURS) 21 mg/24hr patch Place 1 patch (21 mg total) onto the skin daily. For smoking cessation Patient not taking: Reported  on 03/01/2015 02/08/15   Sanjuana Kava, NP  oxyCODONE-acetaminophen (PERCOCET/ROXICET) 5-325 MG tablet Take 1 tablet by mouth every 6 (six) hours as needed. pain 02/18/15   Historical Provider, MD   BP 115/57 mmHg  Pulse 91  Temp(Src) 100.4 F (38 C) (Oral)  Resp 12  Ht  (1.727 m)  Wt 178 lb (80.74 kg)  BMI 27.07 kg/m2  SpO2 98% Physical Exam  Constitutional: He appears well-developed and well-nourished. No distress.  Flushed appearing  HENT:  Head: Normocephalic and atraumatic.  Eyes: Conjunctivae are normal. No scleral icterus.  Neck: Normal range of motion. Neck supple.  Cardiovascular: Regular rhythm and normal heart sounds.   Tachycardic  Pulmonary/Chest: Effort normal and breath sounds normal. No respiratory distress.  Abdominal: Soft. There is no tenderness.  Musculoskeletal: He exhibits no edema.  Left axilla with small pustule, about 1 cm of erythema, no fluctuance. There is a golf ball sized axillary lymph node which is exquisitely tender to palpation.  Neurological: He is alert.  Skin: Skin is warm and dry. He is not diaphoretic.  Psychiatric: His behavior is normal.  Nursing note and vitals reviewed.   ED Course  Procedures (including critical care time) Labs Review Labs Reviewed  COMPREHENSIVE METABOLIC PANEL - Abnormal; Notable for the following:    Potassium 3.2 (*)    Glucose, Bld 133 (*)    Calcium 8.8 (*)    All other components within normal limits  CBC WITH DIFFERENTIAL/PLATELET - Abnormal; Notable for the following:    WBC 12.8 (*)    Platelets 145 (*)    Neutro Abs 11.1 (*)    All other components within normal limits  I-STAT CG4 LACTIC ACID, ED - Abnormal; Notable for the following:    Lactic Acid, Venous 2.20 (*)    All other components within normal limits  CULTURE, BLOOD (ROUTINE X 2)  CULTURE, BLOOD (ROUTINE X 2)  URINE CULTURE  URINALYSIS, ROUTINE W REFLEX MICROSCOPIC (NOT AT [facility])  I-STAT CG4 LACTIC ACID, ED    Imaging Review No  results found. I have personally reviewed and evaluated these images and lab results as part of my medical decision-making.   EKG Interpretation None      MDM   Final diagnoses:  Sepsis, due to unspecified organism [facility])  Abscess    Patient here with left axillary infection, no drainable abscess seen. On bedside ultrasound. He has impressive left axillary reactive lymphadenopathy and systemic inflammatory response. Elevated lactate, elevated white count, fever, tachycardia. Patient begun on sepsis protocol. He'll be admitted to stepdown by Dr. Toniann Fail. Pierce safe for admission    Arthor Captain, PA-C 03/02/15 0053  Elwin Mocha, MD 03/04/15 [phone]

## 2015-03-01 NOTE — ED Notes (Signed)
Pt states he was seen yesterday for chills/hot flashes and a possible abscess to left axillary area. States he left AMA because he didn't want a catheter. States his symptoms have gotten worse, and he's worried it may be in his bloodstream.

## 2015-03-02 ENCOUNTER — Observation Stay (HOSPITAL_COMMUNITY): Payer: Self-pay

## 2015-03-02 ENCOUNTER — Encounter (HOSPITAL_COMMUNITY): Payer: Self-pay | Admitting: Internal Medicine

## 2015-03-02 ENCOUNTER — Inpatient Hospital Stay (HOSPITAL_COMMUNITY): Payer: Self-pay

## 2015-03-02 DIAGNOSIS — D696 Thrombocytopenia, unspecified: Secondary | ICD-10-CM | POA: Diagnosis present

## 2015-03-02 DIAGNOSIS — L0291 Cutaneous abscess, unspecified: Secondary | ICD-10-CM | POA: Insufficient documentation

## 2015-03-02 DIAGNOSIS — A419 Sepsis, unspecified organism: Principal | ICD-10-CM | POA: Diagnosis present

## 2015-03-02 DIAGNOSIS — F4310 Post-traumatic stress disorder, unspecified: Secondary | ICD-10-CM

## 2015-03-02 LAB — COMPREHENSIVE METABOLIC PANEL
ALBUMIN: 3.1 g/dL — AB (ref 3.5–5.0)
ALT: 16 U/L — AB (ref 17–63)
AST: 20 U/L (ref 15–41)
Alkaline Phosphatase: 36 U/L — ABNORMAL LOW (ref 38–126)
Anion gap: 4 — ABNORMAL LOW (ref 5–15)
BILIRUBIN TOTAL: 0.8 mg/dL (ref 0.3–1.2)
BUN: 8 mg/dL (ref 6–20)
CO2: 25 mmol/L (ref 22–32)
CREATININE: 0.84 mg/dL (ref 0.61–1.24)
Calcium: 7.4 mg/dL — ABNORMAL LOW (ref 8.9–10.3)
Chloride: 109 mmol/L (ref 101–111)
GFR calc Af Amer: >60 mL/min (ref >60)
GFR calc non Af Amer: >60 mL/min (ref >60)
GLUCOSE: 106 mg/dL — AB (ref 65–99)
POTASSIUM: 4 mmol/L (ref 3.5–5.1)
Sodium: 138 mmol/L (ref 135–145)
TOTAL PROTEIN: 5.9 g/dL — AB (ref 6.5–8.1)

## 2015-03-02 LAB — PROTIME-INR
INR: 1.01 (ref 0.00–1.49)
PROTHROMBIN TIME: 13.5 s (ref 11.6–15.2)

## 2015-03-02 LAB — URINALYSIS, ROUTINE W REFLEX MICROSCOPIC
BILIRUBIN URINE: NEGATIVE
GLUCOSE, UA: NEGATIVE mg/dL
HGB URINE DIPSTICK: NEGATIVE
KETONES UR: NEGATIVE mg/dL
Nitrite: NEGATIVE
PROTEIN: NEGATIVE mg/dL
Specific Gravity, Urine: 1.021 (ref 1.005–1.030)
Urobilinogen, UA: 1 mg/dL (ref 0.0–1.0)
pH: 6 (ref 5.0–8.0)

## 2015-03-02 LAB — CBC WITH DIFFERENTIAL/PLATELET
BASOS ABS: 0 10*3/uL (ref 0.0–0.1)
BASOS PCT: 0 %
Eosinophils Absolute: 0 10*3/uL (ref 0.0–0.7)
Eosinophils Relative: 0 %
HEMATOCRIT: 37.6 % — AB (ref 39.0–52.0)
HEMOGLOBIN: 12.7 g/dL — AB (ref 13.0–17.0)
LYMPHS PCT: 18 %
Lymphs Abs: 1.7 10*3/uL (ref 0.7–4.0)
MCH: 30 pg (ref 26.0–34.0)
MCHC: 33.8 g/dL (ref 30.0–36.0)
MCV: 88.7 fL (ref 78.0–100.0)
MONO ABS: 0.9 10*3/uL (ref 0.1–1.0)
Monocytes Relative: 9 %
NEUTROS ABS: 6.7 10*3/uL (ref 1.7–7.7)
NEUTROS PCT: 73 %
Platelets: 127 10*3/uL — ABNORMAL LOW (ref 150–400)
RBC: 4.24 MIL/uL (ref 4.22–5.81)
RDW: 13.7 % (ref 11.5–15.5)
WBC: 9.3 10*3/uL (ref 4.0–10.5)

## 2015-03-02 LAB — LACTIC ACID, PLASMA
Lactic Acid, Venous: 0.9 mmol/L (ref 0.5–2.0)
Lactic Acid, Venous: 0.7 mmol/L (ref 0.5–2.0)

## 2015-03-02 LAB — URINE MICROSCOPIC-ADD ON

## 2015-03-02 LAB — APTT: aPTT: 31 seconds (ref 24–37)

## 2015-03-02 LAB — PROCALCITONIN: Procalcitonin: 1.94 ng/mL

## 2015-03-02 LAB — I-STAT CG4 LACTIC ACID, ED: Lactic Acid, Venous: 0.66 mmol/L (ref 0.5–2.0)

## 2015-03-02 MED ORDER — ARIPIPRAZOLE 15 MG PO TABS
15.0000 mg | ORAL_TABLET | Freq: Every day | ORAL | Status: DC
  Administered 2015-03-02 – 2015-03-03 (×2): 15 mg via ORAL
  Filled 2015-03-02 (×2): qty 1

## 2015-03-02 MED ORDER — ACETAMINOPHEN 650 MG RE SUPP: 650.0000 mg | Freq: Four times a day (QID) | RECTAL | Status: DC | PRN

## 2015-03-02 MED ORDER — SODIUM CHLORIDE 0.9 % IV SOLN
INTRAVENOUS | Status: DC
  Administered 2015-03-02: 13:00:00 via INTRAVENOUS

## 2015-03-02 MED ORDER — CITALOPRAM HYDROBROMIDE 40 MG PO TABS
40.0000 mg | ORAL_TABLET | Freq: Every day | ORAL | Status: DC
Start: 2015-03-02 — End: 2015-03-03
  Administered 2015-03-02 – 2015-03-03 (×2): 40 mg via ORAL
  Filled 2015-03-02 (×2): qty 1

## 2015-03-02 MED ORDER — LIDOCAINE-EPINEPHRINE 1 %-1:100000 IJ SOLN
20.0000 mL | Freq: Once | INTRAMUSCULAR | Status: AC
  Administered 2015-03-02: 20 mL
  Filled 2015-03-02: qty 20

## 2015-03-02 MED ORDER — PANTOPRAZOLE SODIUM 40 MG PO TBEC
40.0000 mg | DELAYED_RELEASE_TABLET | Freq: Every day | ORAL | Status: DC
  Administered 2015-03-02 – 2015-03-03 (×2): 40 mg via ORAL
  Filled 2015-03-02 (×2): qty 1

## 2015-03-02 MED ORDER — HYDROXYZINE HCL 25 MG PO TABS: 25.0000 mg | ORAL_TABLET | Freq: Three times a day (TID) | ORAL | Status: DC | PRN

## 2015-03-02 MED ORDER — NICOTINE 21 MG/24HR TD PT24
21.0000 mg | MEDICATED_PATCH | Freq: Every day | TRANSDERMAL | Status: DC
  Administered 2015-03-02: 21 mg via TRANSDERMAL
  Filled 2015-03-02 (×2): qty 1

## 2015-03-02 MED ORDER — ACETAMINOPHEN 325 MG PO TABS
650.0000 mg | ORAL_TABLET | Freq: Four times a day (QID) | ORAL | Status: DC | PRN
  Administered 2015-03-02: 650 mg via ORAL
  Filled 2015-03-02: qty 2

## 2015-03-02 MED ORDER — HYDROMORPHONE HCL 1 MG/ML IJ SOLN
1.0000 mg | Freq: Once | INTRAMUSCULAR | Status: AC
  Administered 2015-03-02: 1 mg via INTRAVENOUS
  Filled 2015-03-02: qty 1

## 2015-03-02 MED ORDER — TRAZODONE HCL 100 MG PO TABS
100.0000 mg | ORAL_TABLET | Freq: Every day | ORAL | Status: DC
  Administered 2015-03-02: 100 mg via ORAL
  Filled 2015-03-02 (×2): qty 1

## 2015-03-02 MED ORDER — ONDANSETRON HCL 4 MG PO TABS: 4.0000 mg | ORAL_TABLET | Freq: Four times a day (QID) | ORAL | Status: DC | PRN

## 2015-03-02 MED ORDER — CLONAZEPAM 1 MG PO TABS
1.0000 mg | ORAL_TABLET | Freq: Two times a day (BID) | ORAL | Status: DC | PRN
  Administered 2015-03-02: 1 mg via ORAL
  Filled 2015-03-02: qty 1

## 2015-03-02 MED ORDER — GABAPENTIN 300 MG PO CAPS
300.0000 mg | ORAL_CAPSULE | Freq: Three times a day (TID) | ORAL | Status: DC
  Administered 2015-03-02 – 2015-03-03 (×4): 300 mg via ORAL
  Filled 2015-03-02 (×6): qty 1

## 2015-03-02 MED ORDER — SODIUM CHLORIDE 0.9 % IV SOLN
INTRAVENOUS | Status: DC
  Administered 2015-03-02: 1000 mL via INTRAVENOUS

## 2015-03-02 MED ORDER — PREGABALIN 75 MG PO CAPS
75.0000 mg | ORAL_CAPSULE | Freq: Three times a day (TID) | ORAL | Status: DC
  Administered 2015-03-02 – 2015-03-03 (×4): 75 mg via ORAL
  Filled 2015-03-02 (×4): qty 1

## 2015-03-02 MED ORDER — PIPERACILLIN-TAZOBACTAM 3.375 G IVPB
3.3750 g | Freq: Three times a day (TID) | INTRAVENOUS | Status: DC
  Administered 2015-03-02 – 2015-03-03 (×4): 3.375 g via INTRAVENOUS
  Filled 2015-03-02 (×6): qty 50

## 2015-03-02 MED ORDER — MORPHINE SULFATE (PF) 2 MG/ML IV SOLN
1.0000 mg | INTRAVENOUS | Status: DC | PRN
Start: 2015-03-02 — End: 2015-03-03
  Administered 2015-03-02 – 2015-03-03 (×8): 1 mg via INTRAVENOUS
  Filled 2015-03-02 (×8): qty 1

## 2015-03-02 MED ORDER — MIRTAZAPINE 15 MG PO TABS
15.0000 mg | ORAL_TABLET | Freq: Every day | ORAL | Status: DC
  Administered 2015-03-02: 15 mg via ORAL
  Filled 2015-03-02 (×2): qty 1

## 2015-03-02 MED ORDER — POTASSIUM CHLORIDE CRYS ER 20 MEQ PO TBCR
40.0000 meq | EXTENDED_RELEASE_TABLET | Freq: Once | ORAL | Status: AC
  Administered 2015-03-02: 40 meq via ORAL
  Filled 2015-03-02: qty 2

## 2015-03-02 MED ORDER — OXYCODONE-ACETAMINOPHEN 5-325 MG PO TABS
1.0000 | ORAL_TABLET | Freq: Four times a day (QID) | ORAL | Status: DC | PRN
  Administered 2015-03-02 – 2015-03-03 (×3): 1 via ORAL
  Filled 2015-03-02 (×3): qty 1

## 2015-03-02 MED ORDER — SODIUM CHLORIDE 0.9 % IV SOLN
Freq: Once | INTRAVENOUS | Status: AC
  Administered 2015-03-02: 02:00:00 via INTRAVENOUS

## 2015-03-02 MED ORDER — ONDANSETRON HCL 4 MG/2ML IJ SOLN: 4.0000 mg | Freq: Four times a day (QID) | INTRAMUSCULAR | Status: DC | PRN

## 2015-03-02 MED ORDER — VANCOMYCIN HCL IN DEXTROSE 1-5 GM/200ML-% IV SOLN
1000.0000 mg | Freq: Three times a day (TID) | INTRAVENOUS | Status: DC
  Administered 2015-03-02 – 2015-03-03 (×4): 1000 mg via INTRAVENOUS
  Filled 2015-03-02 (×5): qty 200

## 2015-03-02 MED ORDER — DIVALPROEX SODIUM ER 500 MG PO TB24
750.0000 mg | ORAL_TABLET | Freq: Every day | ORAL | Status: DC
  Administered 2015-03-02 – 2015-03-03 (×2): 750 mg via ORAL
  Filled 2015-03-02 (×2): qty 1

## 2015-03-02 NOTE — Discharge Instructions (Signed)

## 2015-03-02 NOTE — Progress Notes (Signed)
ANTIBIOTIC CONSULT NOTE - INITIAL  Pharmacy Consult for Vancomycin and Zosyn  Indication: Sepsis, axillary abscess  No Known Allergies  Patient Measurements: Height: 5\' 8"  (172.7 cm) Weight: 178 lb (80.74 kg) IBW/kg (Calculated) : 68.4 Adjusted Body Weight:   Vital Signs: Temp: 98.1 F (36.7 C) (10/27 0635) Temp Source: Oral (10/27 0635) BP: 109/64 mmHg (10/27 0635) Pulse Rate: 61 (10/27 0635) Intake/Output from previous day: 10/26 0701 - 10/27 0700 In: 450 [I.V.:450] Out: 500 [Urine:500] Intake/Output from this shift: Total I/O In: 450 [I.V.:450] Out: 500 [Urine:500]  Labs:  Recent Labs  03/01/15 2219 03/02/15 0348  WBC 12.8* 9.3  HGB 14.7 12.7*  PLT 145* 127*  CREATININE 0.88 0.84   Estimated Creatinine Clearance: 118.8 mL/min (by C-G formula based on Cr of 0.84). No results for input(s): VANCOTROUGH, VANCOPEAK, VANCORANDOM, GENTTROUGH, GENTPEAK, GENTRANDOM, TOBRATROUGH, TOBRAPEAK, TOBRARND, AMIKACINPEAK, AMIKACINTROU, AMIKACIN in the last 72 hours.   Microbiology: No results found for this or any previous visit (from the past 720 hour(s)).  Medical History: Past Medical History  Diagnosis Date  . Rib fracture   . Orbital fracture (HCC)   . Depression   . Anxiety   . Kidney stone   . PTSD (post-traumatic stress disorder)   . Nephrolithiasis     Medications:  Anti-infectives    Start     Dose/Rate Route Frequency Ordered Stop   03/02/15 0800  vancomycin (VANCOCIN) IVPB 1000 mg/200 mL premix     1,000 mg 200 mL/hr over 60 Minutes Intravenous Every 8 hours 03/02/15 0650     03/02/15 0700  piperacillin-tazobactam (ZOSYN) IVPB 3.375 g     3.375 g 12.5 mL/hr over 240 Minutes Intravenous 3 times per day 03/02/15 0650     03/01/15 2245  piperacillin-tazobactam (ZOSYN) IVPB 3.375 g     3.375 g 100 mL/hr over 30 Minutes Intravenous  Once 03/01/15 2237 03/01/15 2337   03/01/15 2245  vancomycin (VANCOCIN) IVPB 1000 mg/200 mL premix     1,000 mg 200 mL/hr  over 60 Minutes Intravenous  Once 03/01/15 2237 03/02/15 0047     Assessment: Patient with sepsis, left axillary abscess.  First dose of antibiotics already given.  Goal of Therapy:  Vancomycin trough level 15-20 mcg/ml  Zosyn based on renal function  Appropriate antibiotic dosing for renal function; eradication of infection   Plan:  Measure antibiotic drug levels at steady state Follow up culture results Vancomycin 1gm iv q8hr  Zosyn 3.375g IV Q8H infused over 4hrs.   Darlina GuysGrimsley Jr, Jacquenette ShoneJulian Crowford 03/02/2015,6:52 AM

## 2015-03-02 NOTE — Consult Note (Signed)
Reason for Consult: left axillary abscess Referring Physician: Dr. Gean Birchwood   HPI: Ricardo Sims is a healthy 34 year old male with no significant past medical history presented to [facility] with left axillary pain associated with fever and chills.  Duration is 2 days.  Denies any drainage.  Denies previous symptoms.  Initially was found to be hypotensive and tachycardic managed with IV fluids.  Leukocytosis has resolved from 12.8k.  Lactic acidosis has resolved.  The patient has been on cipro/vanc.  US showed 1.7x.4cm phlegmon or an early abscess.  We have therefore been asked to evaluate.   Past Medical History  Diagnosis Date  . Rib fracture   . Orbital fracture ([address])   . Depression   . Anxiety   . Kidney stone   . PTSD (post-traumatic stress disorder)   . Nephrolithiasis     Past Surgical History  Procedure Laterality Date  . Lithotripsy    . Lithotripsy    . Renal artery stent      Family History  Problem Relation Age of Onset  . Diabetes Mellitus II Mother   . Diabetes Mellitus II Brother   . Depression Brother   . Diabetes Mellitus II Daughter     Social History:  reports that he has been smoking.  He does not have any smokeless tobacco history on file. He reports that he does not drink alcohol or use illicit drugs.  Allergies: No Known Allergies  Medications:  Scheduled Meds: . ARIPiprazole  15 mg Oral Daily  . citalopram  40 mg Oral Daily  . divalproex  750 mg Oral Daily  . gabapentin  300 mg Oral TID  . lidocaine-EPINEPHrine  20 mL Infiltration Once  . mirtazapine  15 mg Oral QHS  . pantoprazole  40 mg Oral Daily  . piperacillin-tazobactam (ZOSYN)  IV  3.375 g Intravenous 3 times per day  . pregabalin  75 mg Oral TID  . traZODone  100 mg Oral QHS  . vancomycin  1,000 mg Intravenous Q8H   Continuous Infusions: . sodium chloride 1,000 mL (03/02/15 0330)   PRN Meds:.acetaminophen **OR** acetaminophen, clonazePAM, hydrOXYzine, morphine injection,  ondansetron **OR** ondansetron (ZOFRAN) IV   Results for orders placed or performed during the hospital encounter of 03/01/15 (from the past 48 hour(s))  Comprehensive metabolic panel     Status: Abnormal   Collection Time: 03/01/15 10:19 PM  Result Value Ref Range   Sodium 140 135 - 145 mmol/L   Potassium 3.2 (L) 3.5 - 5.1 mmol/L   Chloride 108 101 - 111 mmol/L   CO2 23 22 - 32 mmol/L   Glucose, Bld 133 (H) 65 - 99 mg/dL   BUN 8 6 - 20 mg/dL   Creatinine, Ser 0.88 0.61 - 1.24 mg/dL   Calcium 8.8 (L) 8.9 - 10.3 mg/dL   Total Protein 7.0 6.5 - 8.1 g/dL   Albumin 3.9 3.5 - 5.0 g/dL   AST 29 15 - 41 U/L   ALT 21 17 - 63 U/L   Alkaline Phosphatase 42 38 - 126 U/L   Total Bilirubin 0.6 0.3 - 1.2 mg/dL   GFR calc non Af Amer >60 >60 mL/min   GFR calc Af Amer >60 >60 mL/min    Comment: (NOTE) The eGFR has been calculated using the CKD EPI equation. This calculation has not been validated in all clinical situations. eGFR's persistently <60 mL/min signify possible Chronic Kidney Disease.    Anion gap 9 5 - 15  CBC with  Differential     Status: Abnormal   Collection Time: 03/01/15 10:19 PM  Result Value Ref Range   WBC 12.8 (H) 4.0 - 10.5 K/uL   RBC 4.87 4.22 - 5.81 MIL/uL   Hemoglobin 14.7 13.0 - 17.0 g/dL   HCT 42.6 39.0 - 52.0 %   MCV 87.5 78.0 - 100.0 fL   MCH 30.2 26.0 - 34.0 pg   MCHC 34.5 30.0 - 36.0 g/dL   RDW 13.5 11.5 - 15.5 %   Platelets 145 (L) 150 - 400 K/uL   Neutrophils Relative % 87 %   Neutro Abs 11.1 (H) 1.7 - 7.7 K/uL   Lymphocytes Relative 6 %   Lymphs Abs 0.8 0.7 - 4.0 K/uL   Monocytes Relative 7 %   Monocytes Absolute 0.9 0.1 - 1.0 K/uL   Eosinophils Relative 0 %   Eosinophils Absolute 0.0 0.0 - 0.7 K/uL   Basophils Relative 0 %   Basophils Absolute 0.0 0.0 - 0.1 K/uL  I-Stat CG4 Lactic Acid, ED (Not at [facility])     Status: Abnormal   Collection Time: 03/01/15 10:28 PM  Result Value Ref Range   Lactic Acid, Venous 2.20 (HH) 0.5 - 2.0 mmol/L   Comment  NOTIFIED PHYSICIAN   Urinalysis, Routine w reflex microscopic (not at [facility])     Status: Abnormal   Collection Time: 03/02/15 12:43 AM  Result Value Ref Range   Color, Urine YELLOW YELLOW   APPearance CLEAR CLEAR   Specific Gravity, Urine 1.021 1.005 - 1.030   pH 6.0 5.0 - 8.0   Glucose, UA NEGATIVE NEGATIVE mg/dL   Hgb urine dipstick NEGATIVE NEGATIVE   Bilirubin Urine NEGATIVE NEGATIVE   Ketones, ur NEGATIVE NEGATIVE mg/dL   Protein, ur NEGATIVE NEGATIVE mg/dL   Urobilinogen, UA 1.0 0.0 - 1.0 mg/dL   Nitrite NEGATIVE NEGATIVE   Leukocytes, UA SMALL (A) NEGATIVE  Urine microscopic-add on     Status: None   Collection Time: 03/02/15 12:43 AM  Result Value Ref Range   Squamous Epithelial / LPF RARE RARE   WBC, UA 11-20 <3 WBC/hpf   RBC / HPF 0-2 <3 RBC/hpf   Urine-Other MUCOUS PRESENT     Comment: TRICHOMONAS PRESENT  I-Stat CG4 Lactic Acid, ED (Not at [facility])     Status: None   Collection Time: 03/02/15  1:46 AM  Result Value Ref Range   Lactic Acid, Venous 0.66 0.5 - 2.0 mmol/L  CBC with Differential     Status: Abnormal   Collection Time: 03/02/15  3:48 AM  Result Value Ref Range   WBC 9.3 4.0 - 10.5 K/uL   RBC 4.24 4.22 - 5.81 MIL/uL   Hemoglobin 12.7 (L) 13.0 - 17.0 g/dL   HCT 37.6 (L) 39.0 - 52.0 %   MCV 88.7 78.0 - 100.0 fL   MCH 30.0 26.0 - 34.0 pg   MCHC 33.8 30.0 - 36.0 g/dL   RDW 13.7 11.5 - 15.5 %   Platelets 127 (L) 150 - 400 K/uL   Neutrophils Relative % 73 %   Neutro Abs 6.7 1.7 - 7.7 K/uL   Lymphocytes Relative 18 %   Lymphs Abs 1.7 0.7 - 4.0 K/uL   Monocytes Relative 9 %   Monocytes Absolute 0.9 0.1 - 1.0 K/uL   Eosinophils Relative 0 %   Eosinophils Absolute 0.0 0.0 - 0.7 K/uL   Basophils Relative 0 %   Basophils Absolute 0.0 0.0 - 0.1 K/uL  Comprehensive metabolic panel  Status: Abnormal   Collection Time: 03/02/15  3:48 AM  Result Value Ref Range   Sodium 138 135 - 145 mmol/L   Potassium 4.0 3.5 - 5.1 mmol/L    Comment: DELTA CHECK  NOTED REPEATED TO VERIFY NO VISIBLE HEMOLYSIS    Chloride 109 101 - 111 mmol/L   CO2 25 22 - 32 mmol/L   Glucose, Bld 106 (H) 65 - 99 mg/dL   BUN 8 6 - 20 mg/dL   Creatinine, Ser 0.84 0.61 - 1.24 mg/dL   Calcium 7.4 (L) 8.9 - 10.3 mg/dL   Total Protein 5.9 (L) 6.5 - 8.1 g/dL   Albumin 3.1 (L) 3.5 - 5.0 g/dL   AST 20 15 - 41 U/L   ALT 16 (L) 17 - 63 U/L   Alkaline Phosphatase 36 (L) 38 - 126 U/L   Total Bilirubin 0.8 0.3 - 1.2 mg/dL   GFR calc non Af Amer >60 >60 mL/min   GFR calc Af Amer >60 >60 mL/min    Comment: (NOTE) The eGFR has been calculated using the CKD EPI equation. This calculation has not been validated in all clinical situations. eGFR's persistently <60 mL/min signify possible Chronic Kidney Disease.    Anion gap 4 (L) 5 - 15  Lactic acid, plasma     Status: None   Collection Time: 03/02/15  3:48 AM  Result Value Ref Range   Lactic Acid, Venous 0.9 0.5 - 2.0 mmol/L  Procalcitonin     Status: None   Collection Time: 03/02/15  3:48 AM  Result Value Ref Range   Procalcitonin 1.94 ng/mL    Comment:        Interpretation: PCT > 0.5 ng/mL and <= 2 ng/mL: Systemic infection (sepsis) is possible, but other conditions are known to elevate PCT as well. (NOTE)         ICU PCT Algorithm               Non ICU PCT Algorithm    ----------------------------     ------------------------------         PCT < 0.25 ng/mL                 PCT < 0.1 ng/mL     Stopping of antibiotics            Stopping of antibiotics       strongly encouraged.               strongly encouraged.    ----------------------------     ------------------------------       PCT level decrease by               PCT < 0.25 ng/mL       >= 80% from peak PCT       OR PCT 0.25 - 0.5 ng/mL          Stopping of antibiotics                                             encouraged.     Stopping of antibiotics           encouraged.    ----------------------------     ------------------------------       PCT  level decrease by              PCT >= 0.25 ng/mL       <  80% from peak PCT        AND PCT >= 0.5 ng/mL             Continuing antibiotics                                              encouraged.       Continuing antibiotics            encouraged.    ----------------------------     ------------------------------     PCT level increase compared          PCT > 0.5 ng/mL         with peak PCT AND          PCT >= 0.5 ng/mL             Escalation of antibiotics                                          strongly encouraged.      Escalation of antibiotics        strongly encouraged.   Protime-INR     Status: None   Collection Time: 03/02/15  3:48 AM  Result Value Ref Range   Prothrombin Time 13.5 11.6 - 15.2 seconds   INR 1.01 0.00 - 1.49  APTT     Status: None   Collection Time: 03/02/15  3:48 AM  Result Value Ref Range   aPTT 31 24 - 37 seconds  Lactic acid, plasma     Status: None   Collection Time: 03/02/15  6:20 AM  Result Value Ref Range   Lactic Acid, Venous 0.7 0.5 - 2.0 mmol/L    [place] Misc Soft Tissue  03/02/2015  CLINICAL DATA:  34 year old male with fevers and chills and palpable lump underneath the left arm head with redness, warmth and tenderness to the touch in this region for 1 day. EXAM: SOFT TISSUE ULTRASOUND - MISCELLANEOUS TECHNIQUE: Focused ultrasound examination of the left axillary region over the area of concern was performed. COMPARISON:  No priors. FINDINGS: Focused ultrasound examination of the palpable abnormality in the left axillary region demonstrated several small heterogeneously hypoechoic areas with increased through transmission which measured up to 1.7 x 0.4 x 0.4 cm, and appears to be intercommunicate, concerning for phlegmonous inflammation and probable early abscess. IMPRESSION: 1. Findings are concerning for an early abscess formation in the left axillary region, as above. Electronically Signed   By: Vinnie Langton M.D.   On: 03/02/2015 01:25   Dg  Chest Port 1 View  03/02/2015  CLINICAL DATA:  Acute onset of fever, nausea and vomiting. Body aches. Left axillary dissection and sepsis. Initial encounter. EXAM: PORTABLE CHEST 1 VIEW COMPARISON:  Chest radiograph performed 12/20/2014 FINDINGS: The lungs are well-aerated and clear. There is no evidence of focal opacification, pleural effusion or pneumothorax. The cardiomediastinal silhouette is borderline normal in size. No acute osseous abnormalities are seen. IMPRESSION: No acute cardiopulmonary process seen. Electronically Signed   By: Garald Balding M.D.   On: 03/02/2015 03:36    Review of Systems  All other systems reviewed and are negative.  Blood pressure 115/64, pulse 57, temperature 97.8 F (36.6 C), temperature source Oral, resp. rate 18, height 5' 8"  (1.727 m), weight  80.74 kg (178 lb), SpO2 97 %. Physical Exam  Constitutional: He appears well-developed and well-nourished. No distress.  Cardiovascular: Normal rate, regular rhythm, normal heart sounds and intact distal pulses.  Exam reveals no gallop and no friction rub.   No murmur heard. Respiratory: Effort normal and breath sounds normal. No respiratory distress. He has no wheezes. He has no rales. He exhibits no tenderness.  GI: Soft. Bowel sounds are normal. He exhibits no distension. There is no tenderness. There is no rebound.  Musculoskeletal: Normal range of motion. He exhibits no edema or tenderness.  Skin: Skin is warm and dry. No rash noted. He is not diaphoretic. No erythema. No pallor.  Small left axillary abscess/induration with minimal erythema.   Psychiatric: He has a normal mood and affect. His behavior is normal. Judgment and thought content normal.    Assessment/Plan: Left axillary abscess-will procedure with a bedside I&D this AM.  He may then be discharged home from a surgical standpoint.  I will schedule a follow up in our clinic.  I have instructed him on dressing changes.  He does not need home health.  He  will need 5 days of PO antibiotics.     Abdulkadir Emmanuel ANP-BC 03/02/2015, 8:54 AM

## 2015-03-02 NOTE — H&P (Signed)
[facility] History and Physical  Ricardo Sims WUJ:[id number] DOB: 12/23/1980 DOA: 03/01/2015  Referring physician: Ms. Cammy Copa. PCP: No primary care provider on file.  Specialists: None.  Chief Complaint: Fever and pain.  HPI: Ricardo Sims is a 34 y.o. male with history of depression presents to the ER because of worsening pain in the left axillary region with fever chills and nausea vomiting. Patient had come to the ER and a previously but left AMA. Patient started developing fever and chills with worsening pain in the left axillary area. Patient also had one episode of nausea and vomiting. In the ER sonogram shows developing abscess in the left axillary area and on exam patient has swelling in the left axillary area which is tender to palpation. Patient is also febrile and initially was mildly hypotensive and tachycardic. Patient was given fluid boluses 3 L and admitted for sepsis secondary to left axillary abscess. Denies any abdominal pain and has not had any further episodes of vomiting in the ER. Denies any diarrhea. Denies any trauma or insect bite.  Review of Systems: As presented in the history of presenting illness, rest negative.  Past Medical History  Diagnosis Date  . Rib fracture   . Orbital fracture (HCC)   . Depression   . Anxiety   . Kidney stone   . PTSD (post-traumatic stress disorder)   . Nephrolithiasis    Past Surgical History  Procedure Laterality Date  . Lithotripsy    . Lithotripsy    . Renal artery stent     Social History:  reports that he has been smoking.  He does not have any smokeless tobacco history on file. He reports that he does not drink alcohol or use illicit drugs. Where does patient live home. Can patient participate in ADLs? Yes.  No Known Allergies  Family History:  Family History  Problem Relation Age of Onset  . Diabetes Mellitus II Mother   . Diabetes Mellitus II Brother   . Depression Brother   . Diabetes Mellitus II  Daughter       Prior to Admission medications   Medication Sig Start Date End Date Taking? Authorizing Provider  acetaminophen (TYLENOL) 500 MG tablet Take 500 mg by mouth every 6 (six) hours as needed for mild pain.   Yes Historical Provider, MD  ARIPiprazole (ABILIFY) 15 MG tablet Take 1 tablet (15 mg total) by mouth daily. For mood control 02/08/15  Yes Sanjuana Kava, NP  citalopram (CELEXA) 40 MG tablet Take 1 tablet (40 mg total) by mouth daily. For depression 02/08/15  Yes Sanjuana Kava, NP  clonazePAM (KLONOPIN) 1 MG tablet Take 1 tablet (1 mg total) by mouth 2 (two) times daily as needed (anxiety). 02/08/15  Yes Sanjuana Kava, NP  divalproex (DEPAKOTE ER) 250 MG 24 hr tablet Take 3 tablets (750 mg total) by mouth daily. For mood stabilization 02/08/15  Yes Sanjuana Kava, NP  gabapentin (NEURONTIN) 300 MG capsule Take 1 capsule (300 mg total) by mouth 3 (three) times daily. For pain/agitation 02/08/15  Yes Sanjuana Kava, NP  hydrOXYzine (ATARAX/VISTARIL) 25 MG tablet Take 1 tablet (25 mg total) by mouth 3 (three) times daily as needed for anxiety. 02/08/15  Yes Sanjuana Kava, NP  ibuprofen (ADVIL,MOTRIN) 200 MG tablet Take 200 mg by mouth every 6 (six) hours as needed for fever.   Yes Historical Provider, MD  mirtazapine (REMERON) 15 MG tablet Take 1 tablet (15 mg total) by mouth at bedtime. For  depression/insomnia 02/08/15  Yes Sanjuana KavaAgnes I Nwoko, NP  omeprazole (PRILOSEC) 40 MG capsule Take 1 capsule (40 mg total) by mouth daily. For acid reflux 02/08/15  Yes Sanjuana KavaAgnes I Nwoko, NP  prazosin (MINIPRESS) 2 MG capsule Take 1 capsule (2 mg total) by mouth at bedtime. For nightmares 02/08/15  Yes Sanjuana KavaAgnes I Nwoko, NP  pregabalin (LYRICA) 75 MG capsule Take 1 capsule (75 mg total) by mouth 3 (three) times daily. For pain 02/08/15  Yes Sanjuana KavaAgnes I Nwoko, NP  traZODone (DESYREL) 100 MG tablet Take 1 tablet (100 mg total) by mouth at bedtime. For sleep 02/08/15  Yes Sanjuana KavaAgnes I Nwoko, NP  HYDROcodone-acetaminophen  (NORCO/VICODIN) 5-325 MG tablet Take 1 tablet by mouth every 6 (six) hours as needed. pain 02/20/15   Historical Provider, MD  naproxen (NAPROSYN) 500 MG tablet Take 1 tablet (500 mg total) by mouth 2 (two) times daily. (Use PRN): For pain Patient not taking: Reported on 03/01/2015 02/08/15   Sanjuana KavaAgnes I Nwoko, NP  nicotine (NICODERM CQ - DOSED IN MG/24 HOURS) 21 mg/24hr patch Place 1 patch (21 mg total) onto the skin daily. For smoking cessation Patient not taking: Reported on 03/01/2015 02/08/15   Sanjuana KavaAgnes I Nwoko, NP  oxyCODONE-acetaminophen (PERCOCET/ROXICET) 5-325 MG tablet Take 1 tablet by mouth every 6 (six) hours as needed. pain 02/18/15   Historical Provider, MD    Physical Exam: Filed Vitals:   03/02/15 0200 03/02/15 0314 03/02/15 0440 03/02/15 0635  BP: 112/54 130/76 104/55 109/64  Pulse: 76 76 76 61  Temp:  99.8 F (37.7 C) 98.5 F (36.9 C) 98.1 F (36.7 C)  TempSrc:  Oral Oral Oral  Resp: 26 23 20 20   Height:      Weight:      SpO2: 97% 98% 96% 100%     General:  Moderately built and nourished.  Eyes: Anicteric. No pallor.  ENT: No discharge from the ears eyes nose and mouth.  Neck: No mass felt.  Cardiovascular: S1-S2 heard.  Respiratory: No rhonchi or crepitations.  Abdomen: Soft nontender bowel sounds present.  Skin: Small rash in the left axillary area. There is also tenderness in the left axilla.  Musculoskeletal: Tenderness in the left axilla with swelling.  Psychiatric: Appears normal.  Neurologic: Alert awake oriented to time place and person. Moves all extremities.  Labs on Admission:  Basic Metabolic Panel:  Recent Labs Lab 03/01/15 2219 03/02/15 0348  NA 140 138  K 3.2* 4.0  CL 108 109  CO2 23 25  GLUCOSE 133* 106*  BUN 8 8  CREATININE 0.88 0.84  CALCIUM 8.8* 7.4*   Liver Function Tests:  Recent Labs Lab 03/01/15 2219 03/02/15 0348  AST 29 20  ALT 21 16*  ALKPHOS 42 36*  BILITOT 0.6 0.8  PROT 7.0 5.9*  ALBUMIN 3.9 3.1*   No  results for input(s): LIPASE, AMYLASE in the last 168 hours. No results for input(s): AMMONIA in the last 168 hours. CBC:  Recent Labs Lab 03/01/15 2219 03/02/15 0348  WBC 12.8* 9.3  NEUTROABS 11.1* 6.7  HGB 14.7 12.7*  HCT 42.6 37.6*  MCV 87.5 88.7  PLT 145* 127*   Cardiac Enzymes: No results for input(s): CKTOTAL, CKMB, CKMBINDEX, TROPONINI in the last 168 hours.  BNP (last 3 results) No results for input(s): BNP in the last 8760 hours.  ProBNP (last 3 results) No results for input(s): PROBNP in the last 8760 hours.  CBG: No results for input(s): GLUCAP in the last 168 hours.  Radiological  Exams on Admission: [place] Misc Soft Tissue  03/02/2015  CLINICAL DATA:  34 year old male with fevers and chills and palpable lump underneath the left arm head with redness, warmth and tenderness to the touch in this region for 1 day. EXAM: SOFT TISSUE ULTRASOUND - MISCELLANEOUS TECHNIQUE: Focused ultrasound examination of the left axillary region over the area of concern was performed. COMPARISON:  No priors. FINDINGS: Focused ultrasound examination of the palpable abnormality in the left axillary region demonstrated several small heterogeneously hypoechoic areas with increased through transmission which measured up to 1.7 x 0.4 x 0.4 cm, and appears to be intercommunicate, concerning for phlegmonous inflammation and probable early abscess. IMPRESSION: 1. Findings are concerning for an early abscess formation in the left axillary region, as above. Electronically Signed   By: Trudie Reed M.D.   On: 03/02/2015 01:25   Dg Chest Port 1 View  03/02/2015  CLINICAL DATA:  Acute onset of fever, nausea and vomiting. Body aches. Left axillary dissection and sepsis. Initial encounter. EXAM: PORTABLE CHEST 1 VIEW COMPARISON:  Chest radiograph performed 12/20/2014 FINDINGS: The lungs are well-aerated and clear. There is no evidence of focal opacification, pleural effusion or pneumothorax. The  cardiomediastinal silhouette is borderline normal in size. No acute osseous abnormalities are seen. IMPRESSION: No acute cardiopulmonary process seen. Electronically Signed   By: Roanna Raider M.D.   On: 03/02/2015 03:36     Assessment/Plan Principal Problem:   Sepsis (HCC) Active Problems:   PTSD (post-traumatic stress disorder)   MDD (major depressive disorder), recurrent severe, without psychosis (HCC)   Thrombocytopenia (HCC)   1. Sepsis secondary to developing left axillary abscess - I have discussed with on-call surgeon Dr. Luisa Hart who will be seeing patient in consult. Patient will be kept nothing by mouth in anticipation of possible procedure. Patient is placed on antibiotics for sepsis secondary to left axillary abscess. Continue with hydration. Patient only medications. 2. Thrombocytopenia probably from developing sepsis - follow CBC. 3. Mild hypokalemia - probably from nausea vomiting from developing sepsis. Replace and recheck. Patient's abdomen appears benign. No further episodes of vomiting. 4. PTSD and depression - we'll continue home medication except Minipress as patient blood pressures in the low normal.   DVT Prophylaxis SCDs in anticipation of procedure.  Code Status: Full code.  Family Communication: Family at the bedside.  Disposition Plan: Admit to inpatient.    Lyvonne Cassell N. [facility] Pager [phone].  If 7PM-7AM, please contact night-coverage [email or web address] Password [facility] 03/02/2015, 6:43 AM

## 2015-03-02 NOTE — ED Notes (Signed)
Pt having ultrasound done

## 2015-03-02 NOTE — Progress Notes (Signed)
[facility] PROGRESS NOTE  Myrle Dues BJY:[id number] DOB: 01-04-1981 DOA: 03/01/2015 PCP: No primary care provider on file.  Assessment/Plan: Sepsis secondary to developing left axillary abscess:  Presents with confusion, leukocytosis, fever, RR 22 -S/P I and .  -Follow culture.  -IV Vancomycin and Zosyn.   Thrombocytopenia probably from developing sepsis - follow CBC.  Mild hypokalemia - resolved.   PTSD and depression - we'll continue home medication except Minipress as patient blood pressures in the low normal.   Code Status: Full code. Family Communication: care discussed with patient  Disposition Plan: remain inpatient    Consultants:  surgery  Procedures:  none  Antibiotics:  Vancomycin and Zosyn 10-26  HPI/Subjective: He is feeling better, less confuse.    Objective: Filed Vitals:   03/02/15 0800  BP: 115/64  Pulse: 57  Temp: 97.8 F (36.6 C)  Resp: 18    Intake/Output Summary (Last 24 hours) at 03/02/15 1323 Last data filed at 03/02/15 0630  Gross per 24 hour  Intake    450 ml  Output    500 ml  Net    -50 ml   Filed Weights   03/01/15 2208 03/02/15 0022  Weight: 80.74 kg (178 lb) 80.74 kg (178 lb)    Exam:   General:  NAD  Cardiovascular: S 1, S 2 RRR  Respiratory: CTA  Abdomen: bs present soft, nt  Musculoskeletal: no edema  Left axil;la with clean dressing.   Data Reviewed: Basic Metabolic Panel:  Recent Labs Lab 03/01/15 2219 03/02/15 0348  NA 140 138  K 3.2* 4.0  CL 108 109  CO2 23 25  GLUCOSE 133* 106*  BUN 8 8  CREATININE 0.88 0.84  CALCIUM 8.8* 7.4*   Liver Function Tests:  Recent Labs Lab 03/01/15 2219 03/02/15 0348  AST 29 20  ALT 21 16*  ALKPHOS 42 36*  BILITOT 0.6 0.8  PROT 7.0 5.9*  ALBUMIN 3.9 3.1*   No results for input(s): LIPASE, AMYLASE in the last 168 hours. No results for input(s): AMMONIA in the last 168 hours. CBC:  Recent Labs Lab 03/01/15 2219 03/02/15 0348  WBC  12.8* 9.3  NEUTROABS 11.1* 6.7  HGB 14.7 12.7*  HCT 42.6 37.6*  MCV 87.5 88.7  PLT 145* 127*   Cardiac Enzymes: No results for input(s): CKTOTAL, CKMB, CKMBINDEX, TROPONINI in the last 168 hours. BNP (last 3 results) No results for input(s): BNP in the last 8760 hours.  ProBNP (last 3 results) No results for input(s): PROBNP in the last 8760 hours.  CBG: No results for input(s): GLUCAP in the last 168 hours.  Recent Results (from the past 240 hour(s))  Blood Culture (routine x 2)     Status: None (Preliminary result)   Collection Time: 03/01/15 10:51 PM  Result Value Ref Range Status   Specimen Description BLOOD RIGHT ANTECUBITAL  Final   Special Requests BOTTLES DRAWN AEROBIC AND ANAEROBIC  Final   Culture   Final    NO GROWTH < 12 HOURS Performed at [facility]    Report Status PENDING  Incomplete  Blood Culture (routine x 2)     Status: None (Preliminary result)   Collection Time: 03/01/15 10:51 PM  Result Value Ref Range Status   Specimen Description BLOOD BLOOD RIGHT HAND  Final   Special Requests BOTTLES DRAWN AEROBIC AND ANAEROBIC  Final   Culture   Final    NO GROWTH < 12 HOURS Performed at [facility]  Report Status PENDING  Incomplete     Studies: [place] Misc Soft Tissue  03/02/2015  CLINICAL DATA:  34 year old male with fevers and chills and palpable lump underneath the left arm head with redness, warmth and tenderness to the touch in this region for 1 day. EXAM: SOFT TISSUE ULTRASOUND - MISCELLANEOUS TECHNIQUE: Focused ultrasound examination of the left axillary region over the area of concern was performed. COMPARISON:  No priors. FINDINGS: Focused ultrasound examination of the palpable abnormality in the left axillary region demonstrated several small heterogeneously hypoechoic areas with increased through transmission which measured up to 1.7 x 0.4 x 0.4 cm, and appears to be intercommunicate, concerning for phlegmonous inflammation and  probable early abscess. IMPRESSION: 1. Findings are concerning for an early abscess formation in the left axillary region, as above. Electronically Signed   By: Trudie Reedaniel  Entrikin M.D.   On: 03/02/2015 01:25   Dg Chest Port 1 View  03/02/2015  CLINICAL DATA:  Acute onset of fever, nausea and vomiting. Body aches. Left axillary dissection and sepsis. Initial encounter. EXAM: PORTABLE CHEST 1 VIEW COMPARISON:  Chest radiograph performed 12/20/2014 FINDINGS: The lungs are well-aerated and clear. There is no evidence of focal opacification, pleural effusion or pneumothorax. The cardiomediastinal silhouette is borderline normal in size. No acute osseous abnormalities are seen. IMPRESSION: No acute cardiopulmonary process seen. Electronically Signed   By: Roanna RaiderJeffery  Chang M.D.   On: 03/02/2015 03:36    Scheduled Meds: . ARIPiprazole  15 mg Oral Daily  . citalopram  40 mg Oral Daily  . divalproex  750 mg Oral Daily  . gabapentin  300 mg Oral TID  . mirtazapine  15 mg Oral QHS  . pantoprazole  40 mg Oral Daily  . piperacillin-tazobactam (ZOSYN)  IV  3.375 g Intravenous 3 times per day  . pregabalin  75 mg Oral TID  . traZODone  100 mg Oral QHS  . vancomycin  1,000 mg Intravenous Q8H   Continuous Infusions: . sodium chloride 100 mL/hr at 03/02/15 1236    Principal Problem:   Sepsis (HCC) Active Problems:   PTSD (post-traumatic stress disorder)   MDD (major depressive disorder), recurrent severe, without psychosis (HCC)   Thrombocytopenia (HCC)    Time spent: 35 minutes.     Hartley Barefootegalado, Belkys A  [facility] Pager [phone]. If 7PM-7AM, please contact night-coverage at [email or web address], password [email or web address] 03/02/2015, 1:23 PM  LOS: 0 days

## 2015-03-02 NOTE — Procedures (Signed)
Incision and Drainage Procedure Note  Pre-operative Diagnosis: left axillary abscess  Post-operative Diagnosis: same  Indications: 34 year old male with a 2 day history of left axillary pain, fevers and chills.  He was found to have a 1.7cm phlegmon/abscess to left axillae.    Anesthesia: 1% lidocaine with epinephrine  Procedure Details  The procedure, risks and complications have been discussed in detail (including, but not limited to airway compromise, infection, bleeding) with the patient, and the patient has signed consent to the procedure.  The skin was sterilely prepped and draped over the affected area in the usual fashion. After adequate local anesthesia, I&D with a #11 blade was performed on the left axilla. Purulent drainage: present The patient was observed until stable.  Cultures obtained.    Findings: same  EBL: <5 cc's  Drains: none  Condition: Tolerated procedure well   Complications: none.  Onika Gudiel, ANP-BC

## 2015-03-03 DIAGNOSIS — F332 Major depressive disorder, recurrent severe without psychotic features: Secondary | ICD-10-CM

## 2015-03-03 LAB — URINE CULTURE: Culture: NO GROWTH

## 2015-03-03 MED ORDER — OXYCODONE-ACETAMINOPHEN 5-325 MG PO TABS: 1.0000 | ORAL_TABLET | Freq: Four times a day (QID) | ORAL | Status: DC | PRN

## 2015-03-03 MED ORDER — DOXYCYCLINE HYCLATE 50 MG PO CAPS: 100.0000 mg | ORAL_CAPSULE | Freq: Two times a day (BID) | ORAL | Status: DC

## 2015-03-03 NOTE — Care Management Note (Signed)
Case Management Note  Patient Details  Name: Virgina JockMichael Nees MRN: [id number] Date of Birth: 1980/08/18  Subjective/Objective:       Admitted with axillary abscess, s/p I&D             Action/Plan: Discharge planning, asked to assist patient with d/c meds. Spoke with patient at bedside, discussed cost of abx and narcotics. [facility] Patient states he can afford that. No further needs assessed.  Expected Discharge Date:                  Expected Discharge Plan:  Home/Self Care  In-House Referral:  NA  Discharge planning Services  CM Consult  Post Acute Care Choice:  NA Choice offered to:  NA  DME Arranged:  N/A DME Agency:  NA  HH Arranged:  NA HH Agency:  NA  Status of Service:  Completed, signed off  Medicare Important Message Given:    Date Medicare IM Given:    Medicare IM give by:    Date Additional Medicare IM Given:    Additional Medicare Important Message give by:     If discussed at Long Length of Stay Meetings, dates discussed:    Additional Comments:  Alexis Goodelleele, Cecelia Graciano K, RN 03/03/2015, 10:45 AM

## 2015-03-03 NOTE — Discharge Summary (Signed)
Physician Discharge Summary  Ricardo JockMichael Dymond ZOX:[id number] DOB: 1981-04-26 DOA: 03/01/2015  PCP: No primary care provider on file.  Admit date: 03/01/2015 Discharge date: 03/03/2015  Time spent: 35 minutes  Recommendations for Outpatient Follow-up:  Follow up with surgery for post I and D care, axillary abscess  Discharge Diagnoses:    Sepsis (HCC)   PTSD (post-traumatic stress disorder)   MDD (major depressive disorder), recurrent severe, without psychosis (HCC)   Thrombocytopenia (HCC)   Abscess, axillary   Toxic metabolic encphalopathy   Discharge Condition: stable  Diet recommendation: heart healthy  Filed Weights   03/01/15 2208 03/02/15 0022  Weight: 80.74 kg (178 lb) 80.74 kg (178 lb)    History of present illness:  HPI: Ricardo Sims is a 34 y.o. male with history of depression presents to the ER because of worsening pain in the left axillary region with fever chills and nausea vomiting. Patient had come to the ER and a previously but left AMA. Patient started developing fever and chills with worsening pain in the left axillary area. Patient also had one episode of nausea and vomiting. In the ER sonogram shows developing abscess in the left axillary area and on exam patient has swelling in the left axillary area which is tender to palpation. Patient is also febrile and initially was mildly hypotensive and tachycardic. Patient was given fluid boluses 3 L and admitted for sepsis secondary to left axillary abscess. Denies any abdominal pain and has not had any further episodes of vomiting in the ER. Denies any diarrhea. Denies any trauma or insect bite.  Hospital Course:  Sepsis secondary to developing left axillary abscess:  Presents with confusion, leukocytosis, fever, RR 22 -S/P I and .  -Follow culture. Pending at time of discharge  -IV Vancomycin and Zosyn for 2 days. He will be discharge on Doxycycline for 7 more days.    Thrombocytopenia probably from developing  sepsis - follow CBC.  Mild hypokalemia - resolved.  Toxic metabolic encephalopathy; in setting of infection. Resolved.   PTSD and depression - we'll continue home medication except Minipress as patient blood pressures in the low normal.   Procedures:  none  Consultations:  Surgery  Discharge Exam: Filed Vitals:   03/03/15 0639  BP: 119/68  Pulse: 61  Temp: 97.7 F (36.5 C)  Resp: 18    General: NAD Cardiovascular: S 1, S 2 RRR Respiratory: CTA  Discharge Instructions   Discharge Instructions    Diet - low sodium heart healthy    Complete by:  As directed      Increase activity slowly    Complete by:  As directed           Current Discharge Medication List    START taking these medications   Details  doxycycline (VIBRAMYCIN) 50 MG capsule Take 2 capsules (100 mg total) by mouth 2 (two) times daily. Qty: 28 capsule, Refills: 0      CONTINUE these medications which have CHANGED   Details  oxyCODONE-acetaminophen (PERCOCET/ROXICET) 5-325 MG tablet Take 1 tablet by mouth every 6 (six) hours as needed. pain Qty: 30 tablet, Refills: 0      CONTINUE these medications which have NOT CHANGED   Details  acetaminophen (TYLENOL) 500 MG tablet Take 500 mg by mouth every 6 (six) hours as needed for mild pain.    ARIPiprazole (ABILIFY) 15 MG tablet Take 1 tablet (15 mg total) by mouth daily. For mood control Qty: 30 tablet, Refills: 0    citalopram (CELEXA)  40 MG tablet Take 1 tablet (40 mg total) by mouth daily. For depression Qty: 30 tablet, Refills: 0    clonazePAM (KLONOPIN) 1 MG tablet Take 1 tablet (1 mg total) by mouth 2 (two) times daily as needed (anxiety). Qty: 60 tablet, Refills: 0    divalproex (DEPAKOTE ER) 250 MG 24 hr tablet Take 3 tablets (750 mg total) by mouth daily. For mood stabilization Qty: 90 tablet, Refills: 0    gabapentin (NEURONTIN) 300 MG capsule Take 1 capsule (300 mg total) by mouth 3 (three) times daily. For pain/agitation Qty: 90  capsule, Refills: 0    hydrOXYzine (ATARAX/VISTARIL) 25 MG tablet Take 1 tablet (25 mg total) by mouth 3 (three) times daily as needed for anxiety. Qty: 45 tablet, Refills: 0    mirtazapine (REMERON) 15 MG tablet Take 1 tablet (15 mg total) by mouth at bedtime. For depression/insomnia Qty: 30 tablet, Refills: 0    omeprazole (PRILOSEC) 40 MG capsule Take 1 capsule (40 mg total) by mouth daily. For acid reflux    prazosin (MINIPRESS) 2 MG capsule Take 1 capsule (2 mg total) by mouth at bedtime. For nightmares Qty: 30 capsule, Refills: 0    pregabalin (LYRICA) 75 MG capsule Take 1 capsule (75 mg total) by mouth 3 (three) times daily. For pain Qty: 90 capsule, Refills: 0    traZODone (DESYREL) 100 MG tablet Take 1 tablet (100 mg total) by mouth at bedtime. For sleep Qty: 30 tablet, Refills: 0    nicotine (NICODERM CQ - DOSED IN MG/24 HOURS) 21 mg/24hr patch Place 1 patch (21 mg total) onto the skin daily. For smoking cessation Qty: 28 patch, Refills: 0      STOP taking these medications     ibuprofen (ADVIL,MOTRIN) 200 MG tablet      HYDROcodone-acetaminophen (NORCO/VICODIN) 5-325 MG tablet      naproxen (NAPROSYN) 500 MG tablet        No Known Allergies Follow-up Information    Follow up with [facility] On 03/15/2015.   Specialty:  General Surgery   Why:  arrive by 10:15AM for a 10:45AM wound check   Contact information:   [address] [place] [address] [phone]        The results of significant diagnostics from this hospitalization (including imaging, microbiology, ancillary and laboratory) are listed below for reference.    Significant Diagnostic Studies: [place] Misc Soft Tissue  03/02/2015  CLINICAL DATA:  34 year old male with fevers and chills and palpable lump underneath the left arm head with redness, warmth and tenderness to the touch in this region for 1 day. EXAM: SOFT TISSUE ULTRASOUND - MISCELLANEOUS TECHNIQUE: Focused  ultrasound examination of the left axillary region over the area of concern was performed. COMPARISON:  No priors. FINDINGS: Focused ultrasound examination of the palpable abnormality in the left axillary region demonstrated several small heterogeneously hypoechoic areas with increased through transmission which measured up to 1.7 x 0.4 x 0.4 cm, and appears to be intercommunicate, concerning for phlegmonous inflammation and probable early abscess. IMPRESSION: 1. Findings are concerning for an early abscess formation in the left axillary region, as above. Electronically Signed   By: Trudie Reed M.D.   On: 03/02/2015 01:25   Dg Chest Port 1 View  03/02/2015  CLINICAL DATA:  Acute onset of fever, nausea and vomiting. Body aches. Left axillary dissection and sepsis. Initial encounter. EXAM: PORTABLE CHEST 1 VIEW COMPARISON:  Chest radiograph performed 12/20/2014 FINDINGS: The lungs are well-aerated  and clear. There is no evidence of focal opacification, pleural effusion or pneumothorax. The cardiomediastinal silhouette is borderline normal in size. No acute osseous abnormalities are seen. IMPRESSION: No acute cardiopulmonary process seen. Electronically Signed   By: Roanna Raider M.D.   On: 03/02/2015 03:36    Microbiology: Recent Results (from the past 240 hour(s))  Blood Culture (routine x 2)     Status: None (Preliminary result)   Collection Time: 03/01/15 10:51 PM  Result Value Ref Range Status   Specimen Description BLOOD RIGHT ANTECUBITAL  Final   Special Requests BOTTLES DRAWN AEROBIC AND ANAEROBIC  Final   Culture   Final    NO GROWTH < 24 HOURS Performed at [facility]    Report Status PENDING  Incomplete  Blood Culture (routine x 2)     Status: None (Preliminary result)   Collection Time: 03/01/15 10:51 PM  Result Value Ref Range Status   Specimen Description BLOOD BLOOD RIGHT HAND  Final   Special Requests BOTTLES DRAWN AEROBIC AND ANAEROBIC  Final   Culture    Final    NO GROWTH < 24 HOURS Performed at [facility]    Report Status PENDING  Incomplete  Urine culture     Status: None   Collection Time: 03/02/15 12:43 AM  Result Value Ref Range Status   Specimen Description URINE, CLEAN CATCH  Final   Special Requests NONE  Final   Culture   Final    NO GROWTH 1 DAY Performed at [facility]    Report Status 03/03/2015 FINAL  Final  Culture, routine-abscess     Status: None (Preliminary result)   Collection Time: 03/02/15 10:25 AM  Result Value Ref Range Status   Specimen Description ABSCESS LEFT ARM  Final   Special Requests NONE  Final   Gram Stain   Final    FEW WBC PRESENT,BOTH PMN AND MONONUCLEAR NO SQUAMOUS EPITHELIAL CELLS SEEN RARE GRAM POSITIVE COCCI IN PAIRS IN CLUSTERS Performed at [facility]    Culture PENDING  Incomplete   Report Status PENDING  Incomplete     Labs: Basic Metabolic Panel:  Recent Labs Lab 03/01/15 2219 03/02/15 0348  NA 140 138  K 3.2* 4.0  CL 108 109  CO2 23 25  GLUCOSE 133* 106*  BUN 8 8  CREATININE 0.88 0.84  CALCIUM 8.8* 7.4*   Liver Function Tests:  Recent Labs Lab 03/01/15 2219 03/02/15 0348  AST 29 20  ALT 21 16*  ALKPHOS 42 36*  BILITOT 0.6 0.8  PROT 7.0 5.9*  ALBUMIN 3.9 3.1*   No results for input(s): LIPASE, AMYLASE in the last 168 hours. No results for input(s): AMMONIA in the last 168 hours. CBC:  Recent Labs Lab 03/01/15 2219 03/02/15 0348  WBC 12.8* 9.3  NEUTROABS 11.1* 6.7  HGB 14.7 12.7*  HCT 42.6 37.6*  MCV 87.5 88.7  PLT 145* 127*   Cardiac Enzymes: No results for input(s): CKTOTAL, CKMB, CKMBINDEX, TROPONINI in the last 168 hours. BNP: BNP (last 3 results) No results for input(s): BNP in the last 8760 hours.  ProBNP (last 3 results) No results for input(s): PROBNP in the last 8760 hours.  CBG: No results for input(s): GLUCAP in the last 168 hours.     SignedHartley Barefoot A  [facility] 03/03/2015, 9:47 AM

## 2015-03-03 NOTE — Progress Notes (Signed)
Patient ID: Ricardo Sims, male   DOB: Jul 30, 1980, 34 y.o.   MRN: [id number]     [name] [name], [name] [address]    Phone: [phone] FAX: [phone]     Subjective: Sore, but less throbbing.  Afebrile.   Objective:  Vital signs:  Filed Vitals:   03/02/15 0800 03/02/15 1430 03/02/15 2229 03/03/15 0639  BP: 115/64 114/57 99/60 119/68  Pulse: 57 63 56 61  Temp: 97.8 F (36.6 C) 98 F (36.7 C) 98.3 F (36.8 C) 97.7 F (36.5 C)  TempSrc: Oral Oral Oral Oral  Resp: 18 18 20 18   Height:      Weight:      SpO2: 97% 97% 95% 96%    Last BM Date: 03/02/15  Intake/Output   Yesterday:  10/27 0701 - 10/28 0700 In: 2540 [I.V.:1740; IV Piggyback:800] Out: -  This shift:    I/O last 3 completed shifts: In: 2990 [I.V.:2190; IV Piggyback:800] Out: 500 [Urine:500]    Physical Exam: General: Pt awake/alert/oriented x4 in no  acute distress  Skin: left axillary wound-clean, no erythema or surrounding induration of areas of fluctuance.    Problem List:   Principal Problem:   Sepsis ([place]) Active Problems:   PTSD (post-traumatic stress disorder)   MDD (major depressive disorder), recurrent severe, without psychosis ([place])   Thrombocytopenia ([place])   Abscess    Results:   Labs: Results for orders placed or performed during the hospital encounter of 03/01/15 (from the past 48 hour(s))  Comprehensive metabolic panel     Status: Abnormal   Collection Time: 03/01/15 10:19 PM  Result Value Ref Range   Sodium 140 135 - 145 mmol/L   Potassium 3.2 (L) 3.5 - 5.1 mmol/L   Chloride 108 101 - 111 mmol/L   CO2 23 22 - 32 mmol/L   Glucose, Bld 133 (H) 65 - 99 mg/dL   BUN 8 6 - 20 mg/dL   Creatinine, Ser 0.88 0.61 - 1.24 mg/dL   Calcium 8.8 (L) 8.9 - 10.3 mg/dL   Total Protein 7.0 6.5 - 8.1 g/dL   Albumin 3.9 3.5 - 5.0 g/dL   AST 29 15 - 41 U/L   ALT 21 17 - 63 U/L   Alkaline Phosphatase 42 38 - 126 U/L    Total Bilirubin 0.6 0.3 - 1.2 mg/dL   GFR calc non Af Amer >60 >60 mL/min   GFR calc Af Amer >60 >60 mL/min    Comment: (NOTE) The eGFR has been calculated using the CKD EPI equation. This calculation has not been validated in all clinical situations. eGFR's persistently <60 mL/min signify possible Chronic Kidney Disease.    Anion gap 9 5 - 15  CBC with Differential     Status: Abnormal   Collection Time: 03/01/15 10:19 PM  Result Value Ref Range   WBC 12.8 (H) 4.0 - 10.5 K/uL   RBC 4.87 4.22 - 5.81 MIL/uL   Hemoglobin 14.7 13.0 - 17.0 g/dL   HCT 42.6 39.0 - 52.0 %   MCV 87.5 78.0 - 100.0 fL   MCH 30.2 26.0 - 34.0 pg   MCHC 34.5 30.0 - 36.0 g/dL   RDW 13.5 11.5 - 15.5 %   Platelets 145 (L) 150 - 400 K/uL   Neutrophils Relative % 87 %   Neutro Abs 11.1 (H) 1.7 - 7.7 K/uL   Lymphocytes Relative 6 %   Lymphs Abs 0.8  0.7 - 4.0 K/uL   Monocytes Relative 7 %   Monocytes Absolute 0.9 0.1 - 1.0 K/uL   Eosinophils Relative 0 %   Eosinophils Absolute 0.0 0.0 - 0.7 K/uL   Basophils Relative 0 %   Basophils Absolute 0.0 0.0 - 0.1 K/uL  I-Stat CG4 Lactic Acid, ED (Not at [facility])     Status: Abnormal   Collection Time: 03/01/15 10:28 PM  Result Value Ref Range   Lactic Acid, Venous 2.20 (HH) 0.5 - 2.0 mmol/L   Comment NOTIFIED PHYSICIAN   Blood Culture (routine x 2)     Status: None (Preliminary result)   Collection Time: 03/01/15 10:51 PM  Result Value Ref Range   Specimen Description BLOOD RIGHT ANTECUBITAL    Special Requests BOTTLES DRAWN AEROBIC AND ANAEROBIC 5ML    Culture      NO GROWTH < 24 HOURS Performed at [facility]    Report Status PENDING   Blood Culture (routine x 2)     Status: None (Preliminary result)   Collection Time: 03/01/15 10:51 PM  Result Value Ref Range   Specimen Description BLOOD BLOOD RIGHT HAND    Special Requests BOTTLES DRAWN AEROBIC AND ANAEROBIC 5ML    Culture      NO GROWTH < 24 HOURS Performed at [facility]    Report  Status PENDING   Urinalysis, Routine w reflex microscopic (not at [facility])     Status: Abnormal   Collection Time: 03/02/15 12:43 AM  Result Value Ref Range   Color, Urine YELLOW YELLOW   APPearance CLEAR CLEAR   Specific Gravity, Urine 1.021 1.005 - 1.030   pH 6.0 5.0 - 8.0   Glucose, UA NEGATIVE NEGATIVE mg/dL   Hgb urine dipstick NEGATIVE NEGATIVE   Bilirubin Urine NEGATIVE NEGATIVE   Ketones, ur NEGATIVE NEGATIVE mg/dL   Protein, ur NEGATIVE NEGATIVE mg/dL   Urobilinogen, UA 1.0 0.0 - 1.0 mg/dL   Nitrite NEGATIVE NEGATIVE   Leukocytes, UA SMALL (A) NEGATIVE  Urine microscopic-add on     Status: None   Collection Time: 03/02/15 12:43 AM  Result Value Ref Range   Squamous Epithelial / LPF RARE RARE   WBC, UA 11-20 <3 WBC/hpf   RBC / HPF 0-2 <3 RBC/hpf   Urine-Other MUCOUS PRESENT     Comment: TRICHOMONAS PRESENT  I-Stat CG4 Lactic Acid, ED (Not at [facility])     Status: None   Collection Time: 03/02/15  1:46 AM  Result Value Ref Range   Lactic Acid, Venous 0.66 0.5 - 2.0 mmol/L  CBC with Differential     Status: Abnormal   Collection Time: 03/02/15  3:48 AM  Result Value Ref Range   WBC 9.3 4.0 - 10.5 K/uL   RBC 4.24 4.22 - 5.81 MIL/uL   Hemoglobin 12.7 (L) 13.0 - 17.0 g/dL   HCT 37.6 (L) 39.0 - 52.0 %   MCV 88.7 78.0 - 100.0 fL   MCH 30.0 26.0 - 34.0 pg   MCHC 33.8 30.0 - 36.0 g/dL   RDW 13.7 11.5 - 15.5 %   Platelets 127 (L) 150 - 400 K/uL   Neutrophils Relative % 73 %   Neutro Abs 6.7 1.7 - 7.7 K/uL   Lymphocytes Relative 18 %   Lymphs Abs 1.7 0.7 - 4.0 K/uL   Monocytes Relative 9 %   Monocytes Absolute 0.9 0.1 - 1.0 K/uL   Eosinophils Relative 0 %   Eosinophils Absolute 0.0 0.0 - 0.7 K/uL  Basophils Relative 0 %   Basophils Absolute 0.0 0.0 - 0.1 K/uL  Comprehensive metabolic panel     Status: Abnormal   Collection Time: 03/02/15  3:48 AM  Result Value Ref Range   Sodium 138 135 - 145 mmol/L   Potassium 4.0 3.5 - 5.1 mmol/L    Comment: DELTA CHECK NOTED REPEATED  TO VERIFY NO VISIBLE HEMOLYSIS    Chloride 109 101 - 111 mmol/L   CO2 25 22 - 32 mmol/L   Glucose, Bld 106 (H) 65 - 99 mg/dL   BUN 8 6 - 20 mg/dL   Creatinine, Ser 0.84 0.61 - 1.24 mg/dL   Calcium 7.4 (L) 8.9 - 10.3 mg/dL   Total Protein 5.9 (L) 6.5 - 8.1 g/dL   Albumin 3.1 (L) 3.5 - 5.0 g/dL   AST 20 15 - 41 U/L   ALT 16 (L) 17 - 63 U/L   Alkaline Phosphatase 36 (L) 38 - 126 U/L   Total Bilirubin 0.8 0.3 - 1.2 mg/dL   GFR calc non Af Amer >60 >60 mL/min   GFR calc Af Amer >60 >60 mL/min    Comment: (NOTE) The eGFR has been calculated using the CKD EPI equation. This calculation has not been validated in all clinical situations. eGFR's persistently <60 mL/min signify possible Chronic Kidney Disease.    Anion gap 4 (L) 5 - 15  Lactic acid, plasma     Status: None   Collection Time: 03/02/15  3:48 AM  Result Value Ref Range   Lactic Acid, Venous 0.9 0.5 - 2.0 mmol/L  Procalcitonin     Status: None   Collection Time: 03/02/15  3:48 AM  Result Value Ref Range   Procalcitonin 1.94 ng/mL    Comment:        Interpretation: PCT > 0.5 ng/mL and <= 2 ng/mL: Systemic infection (sepsis) is possible, but other conditions are known to elevate PCT as well. (NOTE)         ICU PCT Algorithm               Non ICU PCT Algorithm    ----------------------------     ------------------------------         PCT < 0.25 ng/mL                 PCT < 0.1 ng/mL     Stopping of antibiotics            Stopping of antibiotics       strongly encouraged.               strongly encouraged.    ----------------------------     ------------------------------       PCT level decrease by               PCT < 0.25 ng/mL       >= 80% from peak PCT       OR PCT 0.25 - 0.5 ng/mL          Stopping of antibiotics                                             encouraged.     Stopping of antibiotics           encouraged.    ----------------------------     ------------------------------       PCT level decrease  by               PCT >= 0.25 ng/mL       < 80% from peak PCT        AND PCT >= 0.5 ng/mL             Continuing antibiotics                                              encouraged.       Continuing antibiotics            encouraged.    ----------------------------     ------------------------------     PCT level increase compared          PCT > 0.5 ng/mL         with peak PCT AND          PCT >= 0.5 ng/mL             Escalation of antibiotics                                          strongly encouraged.      Escalation of antibiotics        strongly encouraged.   Protime-INR     Status: None   Collection Time: 03/02/15  3:48 AM  Result Value Ref Range   Prothrombin Time 13.5 11.6 - 15.2 seconds   INR 1.01 0.00 - 1.49  APTT     Status: None   Collection Time: 03/02/15  3:48 AM  Result Value Ref Range   aPTT 31 24 - 37 seconds  Lactic acid, plasma     Status: None   Collection Time: 03/02/15  6:20 AM  Result Value Ref Range   Lactic Acid, Venous 0.7 0.5 - 2.0 mmol/L    Imaging / Studies: [place] Misc Soft Tissue  03/02/2015  CLINICAL DATA:  33 year old male with fevers and chills and palpable lump underneath the left arm head with redness, warmth and tenderness to the touch in this region for 1 day. EXAM: SOFT TISSUE ULTRASOUND - MISCELLANEOUS TECHNIQUE: Focused ultrasound examination of the left axillary region over the area of concern was performed. COMPARISON:  No priors. FINDINGS: Focused ultrasound examination of the palpable abnormality in the left axillary region demonstrated several small heterogeneously hypoechoic areas with increased through transmission which measured up to 1.7 x 0.4 x 0.4 cm, and appears to be intercommunicate, concerning for phlegmonous inflammation and probable early abscess. IMPRESSION: 1. Findings are concerning for an early abscess formation in the left axillary region, as above. Electronically Signed   By: Vinnie Langton M.D.   On: 03/02/2015 01:25   Dg Chest  Port 1 View  03/02/2015  CLINICAL DATA:  Acute onset of fever, nausea and vomiting. Body aches. Left axillary dissection and sepsis. Initial encounter. EXAM: PORTABLE CHEST 1 VIEW COMPARISON:  Chest radiograph performed 12/20/2014 FINDINGS: The lungs are well-aerated and clear. There is no evidence of focal opacification, pleural effusion or pneumothorax. The cardiomediastinal silhouette is borderline normal in size. No acute osseous abnormalities are seen. IMPRESSION: No acute cardiopulmonary process seen. Electronically Signed   By: Garald Balding M.D.   On: 03/02/2015 03:36    Medications / Allergies:  Scheduled  Meds: . ARIPiprazole  15 mg Oral Daily  . citalopram  40 mg Oral Daily  . divalproex  750 mg Oral Daily  . gabapentin  300 mg Oral TID  . mirtazapine  15 mg Oral QHS  . nicotine  21 mg Transdermal Daily  . pantoprazole  40 mg Oral Daily  . piperacillin-tazobactam (ZOSYN)  IV  3.375 g Intravenous 3 times per day  . pregabalin  75 mg Oral TID  . traZODone  100 mg Oral QHS  . vancomycin  1,000 mg Intravenous Q8H   Continuous Infusions: . sodium chloride 100 mL/hr at 03/02/15 1236   PRN Meds:.acetaminophen **OR** acetaminophen, clonazePAM, hydrOXYzine, morphine injection, ondansetron **OR** ondansetron (ZOFRAN) IV, oxyCODONE-acetaminophen  Antibiotics: Anti-infectives    Start     Dose/Rate Route Frequency Ordered Stop   03/02/15 0800  vancomycin (VANCOCIN) IVPB 1000 mg/200 mL premix     1,000 mg 200 mL/hr over 60 Minutes Intravenous Every 8 hours 03/02/15 0650     03/02/15 0700  piperacillin-tazobactam (ZOSYN) IVPB 3.375 g     3.375 g 12.5 mL/hr over 240 Minutes Intravenous 3 times per day 03/02/15 0650     03/01/15 2245  piperacillin-tazobactam (ZOSYN) IVPB 3.375 g     3.375 g 100 mL/hr over 30 Minutes Intravenous  Once 03/01/15 2237 03/01/15 2337   03/01/15 2245  vancomycin (VANCOCIN) IVPB 1000 mg/200 mL premix     1,000 mg 200 mL/hr over 60 Minutes Intravenous  Once  03/01/15 2237 03/02/15 0047        Assessment/Plan POD#1 left axillary abscess-wound is clean without surrounding erythema.  Stable for discharge.  Would give 4-5 days of PO antibiotics(pt is self pay).  Follow up has been arranged and patient instructed on dressing changes.  Signing off.  Please call for further assistance.   Erby Pian, [facility] Pager [phone])   03/03/2015 8:07 AM

## 2015-03-03 NOTE — Progress Notes (Signed)
Patient discharged.  Leaving with wife.  Pain manageable at this time.  Leaving with two prescriptions and note to return to work.  Room air.  No s/s of distress.  No complaints.  Walking with Nurse Tech off unit at discharge.

## 2015-03-03 NOTE — Clinical Documentation Improvement (Signed)
General Surgery Hospitalist Internal Medicine  Can the diagnosis of "confusion" be further specified?   Toxic encephalopathy  Metabolic encephalopathy  Other  Clinically Undetermined  Supporting Information -- admitted with early Sepsis and abscess Left axilla  Please exercise your independent, professional judgment when responding. A specific answer is not anticipated or expected.  Thank You,  Samariyah Cowles E TutBeverley Fiedlertle RN [facility] [employer] [facility] [place] [phone]

## 2015-03-04 ENCOUNTER — Emergency Department (HOSPITAL_COMMUNITY)
Admission: EM | Admit: 2015-03-04 | Discharge: 2015-03-04 | Disposition: A | Discharge status: Home / Self Care | Payer: Self-pay | Attending: Emergency Medicine | Admitting: Emergency Medicine

## 2015-03-04 ENCOUNTER — Encounter (HOSPITAL_COMMUNITY): Payer: Self-pay | Admitting: *Deleted

## 2015-03-04 DIAGNOSIS — Z792 Long term (current) use of antibiotics: Secondary | ICD-10-CM | POA: Insufficient documentation

## 2015-03-04 DIAGNOSIS — Z87442 Personal history of urinary calculi: Secondary | ICD-10-CM | POA: Insufficient documentation

## 2015-03-04 DIAGNOSIS — F4310 Post-traumatic stress disorder, unspecified: Secondary | ICD-10-CM | POA: Insufficient documentation

## 2015-03-04 DIAGNOSIS — R112 Nausea with vomiting, unspecified: Secondary | ICD-10-CM | POA: Insufficient documentation

## 2015-03-04 DIAGNOSIS — Z79899 Other long term (current) drug therapy: Secondary | ICD-10-CM | POA: Insufficient documentation

## 2015-03-04 DIAGNOSIS — F419 Anxiety disorder, unspecified: Secondary | ICD-10-CM | POA: Insufficient documentation

## 2015-03-04 DIAGNOSIS — F329 Major depressive disorder, single episode, unspecified: Secondary | ICD-10-CM | POA: Insufficient documentation

## 2015-03-04 DIAGNOSIS — Z8781 Personal history of (healed) traumatic fracture: Secondary | ICD-10-CM | POA: Insufficient documentation

## 2015-03-04 DIAGNOSIS — Z720 Tobacco use: Secondary | ICD-10-CM | POA: Insufficient documentation

## 2015-03-04 LAB — I-STAT CG4 LACTIC ACID, ED: Lactic Acid, Venous: 0.93 mmol/L (ref 0.5–2.0)

## 2015-03-04 LAB — CBC WITH DIFFERENTIAL/PLATELET
BASOS ABS: 0 10*3/uL (ref 0.0–0.1)
BASOS PCT: 0 %
Eosinophils Absolute: 0.2 10*3/uL (ref 0.0–0.7)
Eosinophils Relative: 2 %
HEMATOCRIT: 41.1 % (ref 39.0–52.0)
Hemoglobin: 14 g/dL (ref 13.0–17.0)
LYMPHS PCT: 35 %
Lymphs Abs: 3.3 10*3/uL (ref 0.7–4.0)
MCH: 29.8 pg (ref 26.0–34.0)
MCHC: 34.1 g/dL (ref 30.0–36.0)
MCV: 87.4 fL (ref 78.0–100.0)
Monocytes Absolute: 0.5 10*3/uL (ref 0.1–1.0)
Monocytes Relative: 6 %
NEUTROS ABS: 5.4 10*3/uL (ref 1.7–7.7)
NEUTROS PCT: 57 %
PLATELETS: 195 10*3/uL (ref 150–400)
RBC: 4.7 MIL/uL (ref 4.22–5.81)
RDW: 13.3 % (ref 11.5–15.5)
WBC: 9.4 10*3/uL (ref 4.0–10.5)

## 2015-03-04 LAB — COMPREHENSIVE METABOLIC PANEL
ALBUMIN: 3.9 g/dL (ref 3.5–5.0)
ALK PHOS: 39 U/L (ref 38–126)
ALT: 20 U/L (ref 17–63)
ANION GAP: 8 (ref 5–15)
AST: 22 U/L (ref 15–41)
BILIRUBIN TOTAL: 0.4 mg/dL (ref 0.3–1.2)
BUN: 8 mg/dL (ref 6–20)
CALCIUM: 8.7 mg/dL — AB (ref 8.9–10.3)
CO2: 28 mmol/L (ref 22–32)
Chloride: 105 mmol/L (ref 101–111)
Creatinine, Ser: 0.81 mg/dL (ref 0.61–1.24)
Glucose, Bld: 85 mg/dL (ref 65–99)
POTASSIUM: 3.9 mmol/L (ref 3.5–5.1)
Sodium: 141 mmol/L (ref 135–145)
TOTAL PROTEIN: 7.1 g/dL (ref 6.5–8.1)

## 2015-03-04 LAB — CBG MONITORING, ED: Glucose-Capillary: 72 mg/dL (ref 65–99)

## 2015-03-04 MED ORDER — ONDANSETRON HCL 4 MG/2ML IJ SOLN
4.0000 mg | Freq: Once | INTRAMUSCULAR | Status: AC
  Administered 2015-03-04: 4 mg via INTRAVENOUS
  Filled 2015-03-04: qty 2

## 2015-03-04 MED ORDER — MORPHINE SULFATE (PF) 4 MG/ML IV SOLN
4.0000 mg | Freq: Once | INTRAVENOUS | Status: AC
  Administered 2015-03-04: 4 mg via INTRAVENOUS
  Filled 2015-03-04: qty 1

## 2015-03-04 MED ORDER — HYDROCODONE-ACETAMINOPHEN 5-325 MG PO TABS: 2.0000 | ORAL_TABLET | Freq: Four times a day (QID) | ORAL | Status: DC | PRN

## 2015-03-04 MED ORDER — ONDANSETRON HCL 4 MG PO TABS: 4.0000 mg | ORAL_TABLET | Freq: Four times a day (QID) | ORAL | Status: DC

## 2015-03-04 MED ORDER — SODIUM CHLORIDE 0.9 % IV BOLUS (SEPSIS)
1000.0000 mL | Freq: Once | INTRAVENOUS | Status: AC
  Administered 2015-03-04: 1000 mL via INTRAVENOUS

## 2015-03-04 NOTE — ED Provider Notes (Signed)
CSN: [id number]     Arrival date & time 03/04/15  1727 History   First MD Initiated Contact with Patient 03/04/15 1830     Chief Complaint  Patient presents with  . Sepsis Admission 10/26, Disoriented, Vomitting      (Consider location/radiation/quality/duration/timing/severity/associated sxs/prior Treatment) HPI  Ricardo Sims is a 34 y.o. male with PMH significant for anxiety, depression, nephrolithiasis, PTSD who was admitted 03/01/15 for sepsis secondary to left axillary abscess.  Abscess drained by surgery.  Patient received vanc and zosyn during his hospital stay and d/c home with 7 day course of doxycycline.  He is to follow up with surgery 03/15/15.  Oxycodone for pain management.  Patient presents with nausea and vomiting since last night.  Patient states that he took his doxycycline and pain medication and shortly after began experiencing nausea.  He states he has had 7 episodes of NBNB emesis and the nausea is constant.  He states he woke up this morning feeling "out of it' and weak.  Nothing makes it better or worse.  He reports he has not been able to tolerate PO intake.  Endorses fever (101.1) for which he took tylenol about 4:30, but he states he vomited it back up.  He reports left arm pain at abscess site as well, constant, 7/10.  Denies confusion, abdominal pain, CP, SOB, or urinary symptoms.   Past Medical History  Diagnosis Date  . Rib fracture   . Orbital fracture (HCC)   . Depression   . Anxiety   . Kidney stone   . PTSD (post-traumatic stress disorder)   . Nephrolithiasis    Past Surgical History  Procedure Laterality Date  . Lithotripsy    . Lithotripsy    . Renal artery stent     Family History  Problem Relation Age of Onset  . Diabetes Mellitus II Mother   . Diabetes Mellitus II Brother   . Depression Brother   . Diabetes Mellitus II Daughter    Social History  Substance Use Topics  . Smoking status: Current Every Day Smoker -- 1.00 packs/day  .  Smokeless tobacco: None  . Alcohol Use: No    Review of Systems All other systems negative unless otherwise stated in HPI   Allergies  Review of patient's allergies indicates no known allergies.  Home Medications   Prior to Admission medications   Medication Sig Start Date End Date Taking? Authorizing Provider  acetaminophen (TYLENOL) 500 MG tablet Take 500 mg by mouth every 6 (six) hours as needed for mild pain or moderate pain.    Yes Historical Provider, MD  ARIPiprazole (ABILIFY) 15 MG tablet Take 1 tablet (15 mg total) by mouth daily. For mood control 02/08/15  Yes Sanjuana Kava, NP  citalopram (CELEXA) 40 MG tablet Take 1 tablet (40 mg total) by mouth daily. For depression 02/08/15  Yes Sanjuana Kava, NP  clonazePAM (KLONOPIN) 1 MG tablet Take 1 tablet (1 mg total) by mouth 2 (two) times daily as needed (anxiety). Patient taking differently: Take 1 mg by mouth 2 (two) times daily as needed for anxiety.  02/08/15  Yes Sanjuana Kava, NP  divalproex (DEPAKOTE ER) 250 MG 24 hr tablet Take 3 tablets (750 mg total) by mouth daily. For mood stabilization 02/08/15  Yes Sanjuana Kava, NP  doxycycline (VIBRAMYCIN) 50 MG capsule Take 2 capsules (100 mg total) by mouth 2 (two) times daily. 03/03/15  Yes Belkys A Regalado, MD  gabapentin (NEURONTIN) 300 MG capsule Take  1 capsule (300 mg total) by mouth 3 (three) times daily. For pain/agitation 02/08/15  Yes Sanjuana KavaAgnes I Nwoko, NP  hydrOXYzine (ATARAX/VISTARIL) 25 MG tablet Take 1 tablet (25 mg total) by mouth 3 (three) times daily as needed for anxiety. 02/08/15  Yes Sanjuana KavaAgnes I Nwoko, NP  mirtazapine (REMERON) 15 MG tablet Take 1 tablet (15 mg total) by mouth at bedtime. For depression/insomnia 02/08/15  Yes Sanjuana KavaAgnes I Nwoko, NP  omeprazole (PRILOSEC) 40 MG capsule Take 1 capsule (40 mg total) by mouth daily. For acid reflux 02/08/15  Yes Sanjuana KavaAgnes I Nwoko, NP  prazosin (MINIPRESS) 2 MG capsule Take 1 capsule (2 mg total) by mouth at bedtime. For nightmares 02/08/15  Yes  Sanjuana KavaAgnes I Nwoko, NP  pregabalin (LYRICA) 75 MG capsule Take 1 capsule (75 mg total) by mouth 3 (three) times daily. For pain 02/08/15  Yes Sanjuana KavaAgnes I Nwoko, NP  traZODone (DESYREL) 100 MG tablet Take 1 tablet (100 mg total) by mouth at bedtime. For sleep 02/08/15  Yes Sanjuana KavaAgnes I Nwoko, NP  HYDROcodone-acetaminophen (NORCO/VICODIN) 5-325 MG tablet Take 2 tablets by mouth every 6 (six) hours as needed. 03/04/15   Cheri FowlerKayla Jericho Cieslik, PA-C  nicotine (NICODERM CQ - DOSED IN MG/24 HOURS) 21 mg/24hr patch Place 1 patch (21 mg total) onto the skin daily. For smoking cessation Patient not taking: Reported on 03/01/2015 02/08/15   Sanjuana KavaAgnes I Nwoko, NP  ondansetron (ZOFRAN) 4 MG tablet Take 1 tablet (4 mg total) by mouth every 6 (six) hours. 03/04/15   Loanne Emery, PA-C   BP 137/90 mmHg  Pulse 77  Temp(Src) 98.4 F (36.9 C) (Oral)  Resp 18  Wt 178 lb (80.74 kg)  SpO2 100% Physical Exam  Constitutional: He is oriented to person, place, and time. He appears well-developed and well-nourished.  HENT:  Head: Normocephalic and atraumatic.  Mouth/Throat: Oropharynx is clear and moist.  Eyes: Conjunctivae are normal.  Neck: Normal range of motion. Neck supple.  Cardiovascular: Normal rate, regular rhythm and normal heart sounds.   No murmur heard. Pulmonary/Chest: Effort normal and breath sounds normal. No accessory muscle usage or stridor. No respiratory distress. He has no wheezes. He has no rhonchi. He has no rales.  Abdominal: Soft. Bowel sounds are normal. He exhibits no distension. There is no tenderness.  Musculoskeletal: Normal range of motion.  Lymphadenopathy:    He has no cervical adenopathy.  Neurological: He is alert and oriented to person, place, and time.  Speech clear without dysarthria.  Skin: Skin is warm and dry.     Psychiatric: He has a normal mood and affect. His behavior is normal.    ED Course  Procedures (including critical care time) Labs Review Labs Reviewed  COMPREHENSIVE METABOLIC PANEL -  Abnormal; Notable for the following:    Calcium 8.7 (*)    All other components within normal limits  CBC WITH DIFFERENTIAL/PLATELET  CBG MONITORING, ED  I-STAT CG4 LACTIC ACID, ED  I-STAT CG4 LACTIC ACID, ED    Imaging Review No results found. I have personally reviewed and evaluated these images and lab results as part of my medical decision-making.   EKG Interpretation None      MDM   Final diagnoses:  Non-intractable vomiting with nausea, vomiting of unspecified type    Patient presents with nausea and vomiting since last night after taking doxycycline and oxycodone.  VSS, temp 98.4, HR 77, RR 18, BP 137/90, no hypoxia.  Patient appears nontoxic, NAD.  On exam, well appearing abscess site with packing  in place.  No surrounding erythema, warmth, or induration.  Heart RRR, lungs CTAB, abdomen soft, nontender.  Patient unable to tolerate PO intake.  Will start IVF.  Will give zofran for nausea and morphine for pain. Will obtain lactic acid, CMP, and CBC.  Lactic acid 0.93 CBC unremarkable.  WBC 9.4, no leukocytosis. CMP unremarkable.  Patient able to tolerate PO intake.  Patient stable for discharge.  Discussed return precautions.  Patient agrees and acknowledges the above plan.    Case has been discussed with Dr. Clayborne Dana who agrees with the above plan for discharge.    Patient requesting food and drink.  Patient tolerating PO intake.   Cheri Fowler, PA-C 03/04/15 2036  Marily Memos, MD 03/04/15 [phone]

## 2015-03-04 NOTE — Discharge Instructions (Signed)
Nausea and Vomiting  Nausea means you feel sick to your stomach. Throwing up (vomiting) is a reflex where stomach contents come out of your mouth.  HOME CARE   · Take medicine as told by your doctor.  · Do not force yourself to eat. However, you do need to drink fluids.  · If you feel like eating, eat a normal diet as told by your doctor.    Eat rice, wheat, potatoes, bread, lean meats, yogurt, fruits, and vegetables.    Avoid high-fat foods.  · Drink enough fluids to keep your pee (urine) clear or pale yellow.  · Ask your doctor how to replace body fluid losses (rehydrate). Signs of body fluid loss (dehydration) include:    Feeling very thirsty.    Dry lips and mouth.    Feeling dizzy.    Dark pee.    Peeing less than normal.    Feeling confused.    Fast breathing or heart rate.  GET HELP RIGHT AWAY IF:   · You have blood in your throw up.  · You have black or bloody poop (stool).  · You have a bad headache or stiff neck.  · You feel confused.  · You have bad belly (abdominal) pain.  · You have chest pain or trouble breathing.  · You do not pee at least once every 8 hours.  · You have cold, clammy skin.  · You keep throwing up after 24 to 48 hours.  · You have a fever.  MAKE SURE YOU:   · Understand these instructions.  · Will watch your condition.  · Will get help right away if you are not doing well or get worse.     This information is not intended to replace advice given to you by your health care provider. Make sure you discuss any questions you have with your health care provider.     Document Released: 10/09/2007 Document Revised: 07/15/2011 Document Reviewed: 09/21/2010  Elsevier Interactive Patient Education ©2016 [employer]

## 2015-03-04 NOTE — ED Notes (Signed)
Pt reported being d/c x1 day ago but returned for re-evaluation d/t chills, fever (101.1), n/v x7 in 24hrs, generalized weakness, dizziness/lightheadedness, severe pain to lt axillary I&D site. No redness/swelling noted at site. Dsg dry and intact.

## 2015-03-04 NOTE — ED Notes (Addendum)
Pt was admitted for sepsis, left axillary abscess, was discharge on 10/28 and told to follow with [facility]. Pt reports since last night he has vomited 7x, now dry heaving, unable to keep pain meds or antibiotics down. Feeling disoriented and is having difficulty walking. Pain 7/10. Alert and oriented x4.

## 2015-03-05 LAB — CULTURE, ROUTINE-ABSCESS

## 2015-03-06 ENCOUNTER — Encounter (HOSPITAL_COMMUNITY): Payer: Self-pay | Admitting: Emergency Medicine

## 2015-03-06 ENCOUNTER — Emergency Department (HOSPITAL_COMMUNITY)
Admission: EM | Admit: 2015-03-06 | Discharge: 2015-03-07 | Disposition: A | Discharge status: Other Acute Inpatient Hospital | Payer: Self-pay | Attending: Emergency Medicine | Admitting: Emergency Medicine

## 2015-03-06 DIAGNOSIS — F419 Anxiety disorder, unspecified: Secondary | ICD-10-CM | POA: Insufficient documentation

## 2015-03-06 DIAGNOSIS — Z87442 Personal history of urinary calculi: Secondary | ICD-10-CM | POA: Insufficient documentation

## 2015-03-06 DIAGNOSIS — R112 Nausea with vomiting, unspecified: Secondary | ICD-10-CM | POA: Insufficient documentation

## 2015-03-06 DIAGNOSIS — R45851 Suicidal ideations: Secondary | ICD-10-CM

## 2015-03-06 DIAGNOSIS — Z720 Tobacco use: Secondary | ICD-10-CM | POA: Insufficient documentation

## 2015-03-06 DIAGNOSIS — F329 Major depressive disorder, single episode, unspecified: Secondary | ICD-10-CM | POA: Insufficient documentation

## 2015-03-06 DIAGNOSIS — F4310 Post-traumatic stress disorder, unspecified: Secondary | ICD-10-CM | POA: Insufficient documentation

## 2015-03-06 DIAGNOSIS — L02412 Cutaneous abscess of left axilla: Secondary | ICD-10-CM | POA: Insufficient documentation

## 2015-03-06 DIAGNOSIS — Z8781 Personal history of (healed) traumatic fracture: Secondary | ICD-10-CM | POA: Insufficient documentation

## 2015-03-06 HISTORY — DX: Personal history of suicidal behavior: Z91.51

## 2015-03-06 HISTORY — DX: Sepsis, unspecified organism: A41.9

## 2015-03-06 HISTORY — DX: Personal history of self-harm: Z91.5

## 2015-03-06 LAB — CULTURE, BLOOD (ROUTINE X 2)
Culture: NO GROWTH
Culture: NO GROWTH

## 2015-03-06 LAB — CBC
HCT: 46.4 % (ref 39.0–52.0)
Hemoglobin: 15.6 g/dL (ref 13.0–17.0)
MCH: 29.7 pg (ref 26.0–34.0)
MCHC: 33.6 g/dL (ref 30.0–36.0)
MCV: 88.4 fL (ref 78.0–100.0)
PLATELETS: 247 10*3/uL (ref 150–400)
RBC: 5.25 MIL/uL (ref 4.22–5.81)
RDW: 13.6 % (ref 11.5–15.5)
WBC: 10.4 10*3/uL (ref 4.0–10.5)

## 2015-03-06 NOTE — ED Notes (Signed)
Pt reports he was admitted into the hospital d/t sepsis from abscess under arm last week. Pt has had nv x 3 days and unable to hold down medications-including psych meds. Pt reports he has been having suicidal thoughts today.

## 2015-03-06 NOTE — ED Provider Notes (Signed)
CSN: [id number]   Arrival date & time 03/06/15 2236  History  By signing my name below, I, Bethel BornBritney McCollum, attest that this documentation has been prepared under the direction and in the presence of Melene Planan Ellesse Antenucci, DO. Electronically Signed: Bethel BornBritney McCollum, ED Scribe. 03/07/2015. 4:02 AM.  Chief Complaint  Patient presents with  . Emesis  . Suicidal    HPI The history is provided by the patient. No language interpreter was used.   Ricardo Sims is a 34 y.o. male with history of depression, anxiety, and PTSD who presents to the Emergency Department complaining of SI with onset today. Pt states that he has been unable to hold down his psychiatric medication for 6 days since being discharged from the hospital after he was treated for sepsis.  At that time he had an abscess drained at the left axilla and was started on doxycycline. Pt has also been unable to hold down the pain medication that he was discharged with and is having pain at the site of his I&D. Associated symptoms include abdominal soreness, hot flashes, chills. He has history of suicide attempt and was last hospitalized psychiatrically 1 month ago. No fever or congestion.  Past Medical History  Diagnosis Date  . Rib fracture   . Orbital fracture (HCC)   . Depression   . Anxiety   . Kidney stone   . PTSD (post-traumatic stress disorder)   . Nephrolithiasis     Past Surgical History  Procedure Laterality Date  . Lithotripsy    . Lithotripsy    . Renal artery stent      Family History  Problem Relation Age of Onset  . Diabetes Mellitus II Mother   . Diabetes Mellitus II Brother   . Depression Brother   . Diabetes Mellitus II Daughter     Social History  Substance Use Topics  . Smoking status: Current Every Day Smoker -- 1.00 packs/day  . Smokeless tobacco: None  . Alcohol Use: No     Review of Systems  Constitutional: Positive for chills. Negative for fever.       Hot flashes  HENT: Negative for congestion and  facial swelling.   Eyes: Negative for discharge and visual disturbance.  Respiratory: Negative for shortness of breath.   Cardiovascular: Negative for chest pain and palpitations.  Gastrointestinal: Positive for nausea and vomiting. Negative for abdominal pain and diarrhea.  Musculoskeletal: Negative for myalgias and arthralgias.       Pain at the left axilla  Skin: Negative for color change and rash.  Neurological: Negative for tremors, syncope and headaches.  Psychiatric/Behavioral: Positive for suicidal ideas. Negative for confusion and dysphoric mood.   Home Medications   Prior to Admission medications   Medication Sig Start Date End Date Taking? Authorizing Provider  acetaminophen (TYLENOL) 500 MG tablet Take 500 mg by mouth every 6 (six) hours as needed for mild pain or moderate pain.    Yes Historical Provider, MD  ARIPiprazole (ABILIFY) 15 MG tablet Take 1 tablet (15 mg total) by mouth daily. For mood control 02/08/15  Yes Sanjuana KavaAgnes I Nwoko, NP  citalopram (CELEXA) 40 MG tablet Take 1 tablet (40 mg total) by mouth daily. For depression 02/08/15  Yes Sanjuana KavaAgnes I Nwoko, NP  clonazePAM (KLONOPIN) 1 MG tablet Take 1 tablet (1 mg total) by mouth 2 (two) times daily as needed (anxiety). Patient taking differently: Take 1 mg by mouth 2 (two) times daily as needed for anxiety.  02/08/15  Yes Sanjuana KavaAgnes I Nwoko, NP  divalproex (DEPAKOTE ER) 250 MG 24 hr tablet Take 3 tablets (750 mg total) by mouth daily. For mood stabilization 02/08/15  Yes Sanjuana Kava, NP  doxycycline (VIBRAMYCIN) 50 MG capsule Take 2 capsules (100 mg total) by mouth 2 (two) times daily. 03/03/15  Yes Belkys A Regalado, MD  gabapentin (NEURONTIN) 300 MG capsule Take 1 capsule (300 mg total) by mouth 3 (three) times daily. For pain/agitation 02/08/15  Yes Sanjuana Kava, NP  HYDROcodone-acetaminophen (NORCO/VICODIN) 5-325 MG tablet Take 2 tablets by mouth every 6 (six) hours as needed. Patient taking differently: Take 2 tablets by mouth every  6 (six) hours as needed for moderate pain.  03/04/15  Yes Kayla Rose, PA-C  hydrOXYzine (ATARAX/VISTARIL) 25 MG tablet Take 1 tablet (25 mg total) by mouth 3 (three) times daily as needed for anxiety. 02/08/15  Yes Sanjuana Kava, NP  mirtazapine (REMERON) 15 MG tablet Take 1 tablet (15 mg total) by mouth at bedtime. For depression/insomnia 02/08/15  Yes Sanjuana Kava, NP  omeprazole (PRILOSEC) 40 MG capsule Take 1 capsule (40 mg total) by mouth daily. For acid reflux 02/08/15  Yes Sanjuana Kava, NP  ondansetron (ZOFRAN) 4 MG tablet Take 1 tablet (4 mg total) by mouth every 6 (six) hours. 03/04/15  Yes Kayla Rose, PA-C  prazosin (MINIPRESS) 2 MG capsule Take 1 capsule (2 mg total) by mouth at bedtime. For nightmares 02/08/15  Yes Sanjuana Kava, NP  pregabalin (LYRICA) 75 MG capsule Take 1 capsule (75 mg total) by mouth 3 (three) times daily. For pain 02/08/15  Yes Sanjuana Kava, NP  traZODone (DESYREL) 100 MG tablet Take 1 tablet (100 mg total) by mouth at bedtime. For sleep 02/08/15  Yes Sanjuana Kava, NP    Allergies  Review of patient's allergies indicates no known allergies.  Triage Vitals: BP 146/89 mmHg  Pulse 89  Temp(Src) 98.7 F (37.1 C) (Oral)  Resp 16  Ht 5\' 8"  (1.727 m)  Wt 173 lb (78.472 kg)  BMI 26.31 kg/m2  SpO2 99%  Physical Exam  Constitutional: He is oriented to person, place, and time. He appears well-developed and well-nourished.  HENT:  Head: Normocephalic and atraumatic.  Eyes: EOM are normal. Pupils are equal, round, and reactive to light.  Neck: Normal range of motion. Neck supple. No JVD present.  Cardiovascular: Normal rate and regular rhythm.  Exam reveals no gallop and no friction rub.   No murmur heard. Pulmonary/Chest: No respiratory distress. He has no wheezes.  Abdominal: Soft. He exhibits no distension. There is no tenderness. There is no rebound and no guarding.  Musculoskeletal: Normal range of motion.  Quarter sized laceration to left axilla with no  surrounding erythema or induration. No noted edema.  Neurological: He is alert and oriented to person, place, and time.  Skin: No rash noted. No pallor.  Psychiatric: He has a normal mood and affect. His behavior is normal.  Nursing note and vitals reviewed.   ED Course  Procedures   DIAGNOSTIC STUDIES: Oxygen Saturation is 99% on RA, normal by my interpretation.    COORDINATION OF CARE: 12:06 AM Discussed treatment plan which includes TTS consult, lab work, and pain management with pt at bedside and pt agreed to plan.  Labs Reviewed  URINE RAPID DRUG SCREEN, HOSP PERFORMED - Abnormal; Notable for the following:    Opiates POSITIVE (*)    All other components within normal limits  COMPREHENSIVE METABOLIC PANEL  ETHANOL  SALICYLATE LEVEL  ACETAMINOPHEN  LEVEL  CBC    Imaging Review No results found.  I personally reviewed and evaluated these images and lab results as a part of my medical decision-making.   EKG Interpretation  Date/Time:    Ventricular Rate:    PR Interval:    QRS Duration:   QT Interval:    QTC Calculation:   R Axis:     Text Interpretation:      MDM   Final diagnoses:  Suicidal ideation  Abscess of axilla, left    Patient is a 34 y.o. male who presents with SI.  This started a couple days ago.  Patient states that he has been unable to keep anything down for the past 3 days. Feels it he has been unable to keep down his narcotics, as well as his psychiatric medications. Patient has history of SI in the past, family and here concerned that he may make an attempt on his life. Was recently admitted about a month ago for the same.  Also states he has been vomiting. Patient was recently admitted for an abscess to the left armpit which was I&D. Patient is currently on doxycycline.  Patient is well-appearing and nontoxic on exam. No noted induration and erythema surrounding the wound. Doubt hematologic spread at this point. No noted leukocytosis no  dehydration on lab work.  Left axillary lesion well healing. We'll cease packing at this time. Warm compresses 4 times a day.  At this point feel the patient is medically cleared. Able to tolerate by mouth without difficulty in the ED. TTS consulted.   Patient call me into the room as he feels like he's having pain in his left axilla. Patient states that he normally takes narcotics for this pain. Discussed with the patient how I feel this is not needing narcotics. Patient was given Toradol and a gram of Tylenol, feels like his pain is not a 0 into he needs more medicine. Discussed how this would not be the best thing for patient who has underlying psychiatry disorder, and that his pain should be treated with non-habit-forming medications. Patient states that he would like to be transferred to another hospital where his pain will be better treated. Will give the patient 1 pain pill as he is being treated with this as an outpatient. Discussed with patient that he'll get no more narcotics while he is here. Patient continuing to request transfer. He will need to wait for his TTS evaluation.   Patient meets inpatient criteria. We'll be admitted.  The patients results and plan were reviewed and discussed.   Any x-rays performed were independently reviewed by myself.   Differential diagnosis were considered with the presenting HPI.  Medications  ketorolac (TORADOL) 30 MG/ML injection 30 mg (30 mg Intravenous Given 03/07/15 0040)  ondansetron (ZOFRAN) injection 4 mg (4 mg Intravenous Given 03/07/15 0040)  sodium chloride 0.9 % bolus 1,000 mL (0 mLs Intravenous Stopped 03/07/15 0150)  acetaminophen (TYLENOL) tablet 1,000 mg (1,000 mg Oral Given 03/07/15 1610)  oxyCODONE (Oxy IR/ROXICODONE) immediate release tablet 5 mg (5 mg Oral Given 03/07/15 0213)    Filed Vitals:   03/06/15 2240  BP: 146/89  Pulse: 89  Temp: 98.7 F (37.1 C)  TempSrc: Oral  Resp: 16  Height:  (1.727 m)  Weight: 173 lb  (78.472 kg)  SpO2: 99%    Final diagnoses:  Suicidal ideation  Abscess of axilla, left    Admission/ observation were discussed with the admitting physician, patient and/or family and  they are comfortable with the plan.    I personally performed the services described in this documentation, which was scribed in my presence. The recorded information has been reviewed and is accurate.       Melene Plan, DO 03/07/15 [phone]

## 2015-03-06 NOTE — ED Notes (Signed)
Pt has not been able to change bandage since yesterday morning.

## 2015-03-07 ENCOUNTER — Encounter (HOSPITAL_COMMUNITY): Payer: Self-pay

## 2015-03-07 ENCOUNTER — Inpatient Hospital Stay (HOSPITAL_COMMUNITY)
Admission: EM | Admit: 2015-03-07 | Discharge: 2015-03-10 | DRG: 881 | Disposition: A | Discharge status: Home / Self Care | Payer: Federal, State, Local not specified - Other | Source: Intra-hospital | Attending: Psychiatry | Admitting: Psychiatry

## 2015-03-07 ENCOUNTER — Encounter (HOSPITAL_COMMUNITY): Payer: Self-pay | Admitting: *Deleted

## 2015-03-07 DIAGNOSIS — F32A Depression, unspecified: Secondary | ICD-10-CM | POA: Diagnosis present

## 2015-03-07 DIAGNOSIS — R45851 Suicidal ideations: Secondary | ICD-10-CM | POA: Diagnosis present

## 2015-03-07 DIAGNOSIS — F17200 Nicotine dependence, unspecified, uncomplicated: Secondary | ICD-10-CM | POA: Diagnosis present

## 2015-03-07 DIAGNOSIS — F329 Major depressive disorder, single episode, unspecified: Principal | ICD-10-CM | POA: Diagnosis present

## 2015-03-07 LAB — COMPREHENSIVE METABOLIC PANEL
ALBUMIN: 4 g/dL (ref 3.5–5.0)
ALK PHOS: 43 U/L (ref 38–126)
ALT: 25 U/L (ref 17–63)
AST: 31 U/L (ref 15–41)
Anion gap: 8 (ref 5–15)
BILIRUBIN TOTAL: 0.6 mg/dL (ref 0.3–1.2)
BUN: 9 mg/dL (ref 6–20)
CO2: 28 mmol/L (ref 22–32)
Calcium: 9.2 mg/dL (ref 8.9–10.3)
Chloride: 101 mmol/L (ref 101–111)
Creatinine, Ser: 0.95 mg/dL (ref 0.61–1.24)
GFR calc Af Amer: >60 mL/min (ref >60)
GFR calc non Af Amer: >60 mL/min (ref >60)
GLUCOSE: 96 mg/dL (ref 65–99)
Potassium: 4.2 mmol/L (ref 3.5–5.1)
Sodium: 137 mmol/L (ref 135–145)
TOTAL PROTEIN: 6.9 g/dL (ref 6.5–8.1)

## 2015-03-07 LAB — RAPID URINE DRUG SCREEN, HOSP PERFORMED
AMPHETAMINES: NOT DETECTED
Barbiturates: NOT DETECTED
Benzodiazepines: NOT DETECTED
Cocaine: NOT DETECTED
OPIATES: POSITIVE — AB
Tetrahydrocannabinol: NOT DETECTED

## 2015-03-07 LAB — ACETAMINOPHEN LEVEL: Acetaminophen (Tylenol), Serum: 10 ug/mL (ref 10–30)

## 2015-03-07 LAB — SALICYLATE LEVEL

## 2015-03-07 LAB — ETHANOL

## 2015-03-07 MED ORDER — OXYCODONE HCL 5 MG PO TABS
5.0000 mg | ORAL_TABLET | Freq: Once | ORAL | Status: AC
  Administered 2015-03-07: 5 mg via ORAL
  Filled 2015-03-07: qty 1

## 2015-03-07 MED ORDER — HYDROCODONE-ACETAMINOPHEN 5-325 MG PO TABS
1.0000 | ORAL_TABLET | Freq: Four times a day (QID) | ORAL | Status: DC | PRN
  Administered 2015-03-07 – 2015-03-08 (×3): 1 via ORAL
  Filled 2015-03-07 (×3): qty 1

## 2015-03-07 MED ORDER — ARIPIPRAZOLE 15 MG PO TABS
15.0000 mg | ORAL_TABLET | Freq: Every day | ORAL | Status: DC
  Administered 2015-03-07 – 2015-03-10 (×4): 15 mg via ORAL
  Filled 2015-03-07 (×5): qty 1
  Filled 2015-03-07: qty 7
  Filled 2015-03-07: qty 1

## 2015-03-07 MED ORDER — ACETAMINOPHEN 500 MG PO TABS
1000.0000 mg | ORAL_TABLET | Freq: Once | ORAL | Status: AC
  Administered 2015-03-07: 1000 mg via ORAL
  Filled 2015-03-07: qty 2

## 2015-03-07 MED ORDER — PRAZOSIN HCL 2 MG PO CAPS
2.0000 mg | ORAL_CAPSULE | Freq: Every day | ORAL | Status: DC
  Administered 2015-03-07 – 2015-03-09 (×3): 2 mg via ORAL
  Filled 2015-03-07 (×2): qty 1
  Filled 2015-03-07: qty 7
  Filled 2015-03-07 (×2): qty 1

## 2015-03-07 MED ORDER — SODIUM CHLORIDE 0.9 % IV BOLUS (SEPSIS)
1000.0000 mL | Freq: Once | INTRAVENOUS | Status: AC
  Administered 2015-03-07: 1000 mL via INTRAVENOUS

## 2015-03-07 MED ORDER — MAGNESIUM HYDROXIDE 400 MG/5ML PO SUSP: 30.0000 mL | Freq: Every day | ORAL | Status: DC | PRN

## 2015-03-07 MED ORDER — CITALOPRAM HYDROBROMIDE 20 MG PO TABS
20.0000 mg | ORAL_TABLET | Freq: Every day | ORAL | Status: DC
  Administered 2015-03-07 – 2015-03-10 (×4): 20 mg via ORAL
  Filled 2015-03-07 (×3): qty 1
  Filled 2015-03-07: qty 7
  Filled 2015-03-07 (×3): qty 1

## 2015-03-07 MED ORDER — ONDANSETRON HCL 4 MG/2ML IJ SOLN
4.0000 mg | Freq: Once | INTRAMUSCULAR | Status: AC
  Administered 2015-03-07: 4 mg via INTRAVENOUS
  Filled 2015-03-07: qty 2

## 2015-03-07 MED ORDER — IBUPROFEN 600 MG PO TABS
600.0000 mg | ORAL_TABLET | Freq: Four times a day (QID) | ORAL | Status: DC | PRN
Start: 2015-03-07 — End: 2015-03-10
  Administered 2015-03-08 (×2): 600 mg via ORAL
  Filled 2015-03-07 (×2): qty 1

## 2015-03-07 MED ORDER — GABAPENTIN 300 MG PO CAPS
300.0000 mg | ORAL_CAPSULE | Freq: Three times a day (TID) | ORAL | Status: DC
  Administered 2015-03-07 – 2015-03-08 (×3): 300 mg via ORAL
  Filled 2015-03-07 (×9): qty 1

## 2015-03-07 MED ORDER — AMOXICILLIN 500 MG PO CAPS
500.0000 mg | ORAL_CAPSULE | Freq: Three times a day (TID) | ORAL | Status: DC
  Administered 2015-03-07 – 2015-03-10 (×10): 500 mg via ORAL
  Filled 2015-03-07 (×5): qty 1
  Filled 2015-03-07: qty 11
  Filled 2015-03-07 (×7): qty 1
  Filled 2015-03-07: qty 11
  Filled 2015-03-07: qty 1
  Filled 2015-03-07: qty 11

## 2015-03-07 MED ORDER — ALUM & MAG HYDROXIDE-SIMETH 200-200-20 MG/5ML PO SUSP: 30.0000 mL | ORAL | Status: DC | PRN

## 2015-03-07 MED ORDER — TRAZODONE HCL 100 MG PO TABS
100.0000 mg | ORAL_TABLET | Freq: Every day | ORAL | Status: DC
  Filled 2015-03-07: qty 1

## 2015-03-07 MED ORDER — ACETAMINOPHEN 325 MG PO TABS: 650.0000 mg | ORAL_TABLET | Freq: Four times a day (QID) | ORAL | Status: DC | PRN

## 2015-03-07 MED ORDER — TRAZODONE HCL 100 MG PO TABS
100.0000 mg | ORAL_TABLET | Freq: Every evening | ORAL | Status: DC | PRN
  Administered 2015-03-07: 100 mg via ORAL

## 2015-03-07 MED ORDER — HYDROCODONE-ACETAMINOPHEN 5-325 MG PO TABS
1.0000 | ORAL_TABLET | Freq: Four times a day (QID) | ORAL | Status: DC | PRN
  Administered 2015-03-07: 1 via ORAL
  Filled 2015-03-07: qty 1

## 2015-03-07 MED ORDER — PANTOPRAZOLE SODIUM 40 MG PO TBEC
40.0000 mg | DELAYED_RELEASE_TABLET | Freq: Every day | ORAL | Status: DC
  Administered 2015-03-07 – 2015-03-10 (×4): 40 mg via ORAL
  Filled 2015-03-07 (×7): qty 1

## 2015-03-07 MED ORDER — HYDROXYZINE HCL 25 MG PO TABS
25.0000 mg | ORAL_TABLET | Freq: Three times a day (TID) | ORAL | Status: DC | PRN
Start: 2015-03-07 — End: 2015-03-10
  Administered 2015-03-07 – 2015-03-09 (×4): 25 mg via ORAL
  Filled 2015-03-07 (×2): qty 1
  Filled 2015-03-07: qty 10
  Filled 2015-03-07 (×2): qty 1

## 2015-03-07 MED ORDER — KETOROLAC TROMETHAMINE 30 MG/ML IJ SOLN
30.0000 mg | Freq: Once | INTRAMUSCULAR | Status: AC
  Administered 2015-03-07: 30 mg via INTRAVENOUS
  Filled 2015-03-07: qty 1

## 2015-03-07 MED ORDER — NICOTINE 21 MG/24HR TD PT24
21.0000 mg | MEDICATED_PATCH | Freq: Every day | TRANSDERMAL | Status: DC
  Administered 2015-03-07 – 2015-03-10 (×4): 21 mg via TRANSDERMAL
  Filled 2015-03-07 (×8): qty 1

## 2015-03-07 NOTE — ED Notes (Signed)
Pt signing consent forms.

## 2015-03-07 NOTE — Progress Notes (Signed)
Recreation Therapy Notes  Animal-Assisted Activity (AAA) Program Checklist/Progress Notes Patient Eligibility Criteria Checklist & Daily Group note for Rec Tx Intervention  Date: 11.01.2016 Time: 2:15pm Location: [address] Dayroom   AAA/T Program Assumption of Risk Form signed by Patient/ or Parent Legal Guardian yes  Patient is free of allergies or sever asthma yes  Patient reports no fear of animals yes  Patient reports no history of cruelty to animals yes  Patient understands his/her participation is voluntary yes  Behavioral Response: Did not attend.   Ricardo Sims, LRT/CTRS        Ricardo Sims L 03/07/2015 2:33 PM 

## 2015-03-07 NOTE — ED Notes (Signed)
Dr. Adela LankFloyd made aware of pt's pain. No further orders received at this time.

## 2015-03-07 NOTE — ED Provider Notes (Signed)
Picked up pt at 9AM. Please see previous notes for hx/physical and care. Briefly this is a 34yo male with history of MDD, PTSD, polysubstance abuse who presented with SI.  Patient recently hospitalized with sepsis and had left axilla I/D performed by surgery.    Reevaluated patient and wound. Left axilla wound healing normally, no sign of surrounding erythema, no re accumulation of abscess. Recommend wet-dry dressings.   [facility]     Alvira MondayErin Valborg Friar, MD 03/07/15 2129

## 2015-03-07 NOTE — ED Notes (Signed)
Pt updated that MD was in another room and as soon as he came out this RN would ask for something else for pain for him.

## 2015-03-07 NOTE — [facility] Group Notes (Signed)
[facility] LCSW Group Therapy 03/07/2015 1:15 PM  Type of Therapy: Group Therapy- Feelings about Diagnosis  Participation Level: Active   Participation Quality:  Appropriate  Affect:  Appropriate  Cognitive: Alert and Oriented   Insight:  Developing   Engagement in Therapy: Developing/Improving and Engaged   Modes of Intervention: Clarification, Confrontation, Discussion, Education, Exploration, Limit-setting, Orientation, Problem-solving, Rapport Building, Dance movement psychotherapisteality Testing, Socialization and Support  Description of Group:   This group will allow patients to explore their thoughts and feelings about diagnoses they have received. Patients will be guided to explore their level of understanding and acceptance of these diagnoses. Facilitator will encourage patients to process their thoughts and feelings about the reactions of others to their diagnosis, and will guide patients in identifying ways to discuss their diagnosis with significant others in their lives. This group will be process-oriented, with patients participating in exploration of their own experiences as well as giving and receiving support and challenge from other group members.  Summary of Progress/Problems:  Pt was active in discussion, describing that his diagnosis gave him a framework to work out of in order to treat his issues. 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 Pt expressed being apprehensive to seek trauma treatment as he feels it would "open Pandora's box." However, Pt recognizes that his unaddressed issues have caused a strain on his relationships with his daughter and twin brother.  Therapeutic Modalities:   Cognitive Behavioral Therapy Solution Focused Therapy Motivational Interviewing Relapse Prevention Therapy  Chad CordialLauren Carter, LCSWA 03/07/2015 3:51 PM

## 2015-03-07 NOTE — Progress Notes (Signed)
D: Pt denies SI/HI/AVH. Pt is pleasant and cooperative. Pt stated he just wants to get back on his meds, pt stated due to the situation with his abscess and issue with the antibiotic pt had to stop his Psych meds.   A: Pt was offered support and encouragement. Pt was given scheduled medications. Pt was encourage to attend groups. Q 15 minute checks were done for safety.   R:Pt attends groups and interacts well with peers and staff. Pt is taking medication. Pt has no complaints at this time .Pt receptive to treatment and safety maintained on unit.

## 2015-03-07 NOTE — Tx Team (Signed)
Initial Interdisciplinary Treatment Plan   PATIENT STRESSORS: Financial difficulties Health problems Medication change or noncompliance   PATIENT STRENGTHS: Ability for insight Communication skills Motivation for treatment/growth Supportive family/friends   PROBLEM LIST: Problem List/Patient Goals Date to be addressed Date deferred Reason deferred Estimated date of resolution  "Come up with a goo follow up upon release." 03/07/15     " Get my medication adjusted." 03/07/15     Substance abuse 03/07/15     Depression 03/07/15                                    DISCHARGE CRITERIA:  Ability to meet basic life and health needs Improved stabilization in mood, thinking, and/or behavior Medical problems require only outpatient monitoring Motivation to continue treatment in a less acute level of care Reduction of life-threatening or endangering symptoms to within safe limits  PRELIMINARY DISCHARGE PLAN: Attend aftercare/continuing care group Attend PHP/IOP Outpatient therapy Return to previous living arrangement  PATIENT/FAMIILY INVOLVEMENT: This treatment plan has been presented to and reviewed with the patient, Virgina JockMichael Granda, and/or family member.  The patient and family have been given the opportunity to ask questions and make suggestions.  Bethann PunchesJane O Skyylar Kopf 03/07/2015, 5:00 PM

## 2015-03-07 NOTE — H&P (Signed)
Psychiatric Admission Assessment Adult  Patient Identification: Ricardo Sims MRN:  [id number] Date of Evaluation:  03/07/2015 Chief Complaint:  Depressive Disorder Principal Diagnosis: Depression Diagnosis:   Patient Active Problem List   Diagnosis Date Noted  . Depression [F32.9] 03/07/2015    Priority: High  . Sepsis ([place]) [A41.9] 03/02/2015  . Thrombocytopenia ([place]) [D69.6] 03/02/2015  . Abscess [L02.91]   . Depression with suicidal ideation [F32.9, R45.851] 02/06/2015  . Polysubstance abuse [F19.10] 12/21/2014  . Substance induced mood disorder ([place]) [F19.94] 12/21/2014  . PTSD (post-traumatic stress disorder) [F43.10] 12/21/2014  . MDD (major depressive disorder), recurrent severe, without psychosis ([place]) [F33.2] 12/21/2014   History of Present Illness:  TTS note and information verified.   Ricardo Sims is an 34 y.o. male who was brought in by his wife to [facility].  He reported that was discharged about 3-4 days ago after tx for an absess under his arm and resulting sepsis. He reported asdverse reaction to dc meds Doxycycline.  It induced nausea and vomiting since discharge and he has been unable to keep medication down including his MH meds.  His depression worsened and he felt he was more agitated and was developing SI.  Pt sts that he has attempted suicide 2 times previously about 5 years ago after the suicide death of his first wife. Pt was admitted about a month ago with similar MH symptoms. Pt sts that current stressors are financial stresses from the sudden loss of his job & lack of medical insurance about a month ago and recently, pain and nausea related to his medical hospitalization.  Pt denies AVH.   Associated Signs/Symptoms: Depression Symptoms:  depressed mood, fatigue, suicidal thoughts without plan, anxiety, (Hypo) Manic Symptoms:  Irritable Mood, Labiality of Mood, Anxiety Symptoms:  Panic Symptoms, Psychotic Symptoms:  NA PTSD Symptoms: Had a traumatic exposure:   first wife committed suicide Total Time spent with patient: 45 minutes  Past Psychiatric History: schizo, paranoia  Risk to Self: Is patient at risk for suicide?: No Risk to Others:   Prior Inpatient Therapy:   Prior Outpatient Therapy:    Alcohol Screening: Patient refused Alcohol Screening Tool: Yes 1. How often do you have a drink containing alcohol?: Never 2. How many drinks containing alcohol do you have on a typical day when you are drinking?: 1 or 2 3. How often do you have six or more drinks on one occasion?: Never Preliminary Score: 0 4. How often during the last year have you found that you were not able to stop drinking once you had started?: Never 5. How often during the last year have you failed to do what was normally expected from you becasue of drinking?: Never 6. How often during the last year have you needed a first drink in the morning to get yourself going after a heavy drinking session?: Never 7. How often during the last year have you had a feeling of guilt of remorse after drinking?: Never 8. How often during the last year have you been unable to remember what happened the night before because you had been drinking?: Never 9. Have you or someone else been injured as a result of your drinking?: No 10. Has a relative or friend or a doctor or another health worker been concerned about your drinking or suggested you cut down?: No Alcohol Use Disorder Identification Test Final Score (AUDIT): 0 Brief Intervention: Patient declined brief intervention Substance Abuse History in the last 12 months:  Yes.   Consequences of Substance  Abuse: NA Previous Psychotropic Medications: Yes  Psychological Evaluations: Yes  Past Medical History:  Past Medical History  Diagnosis Date  . Rib fracture   . Orbital fracture ([place])   . Depression   . Anxiety   . Kidney stone   . PTSD (post-traumatic stress disorder)   . Nephrolithiasis   . H/O suicide attempt     x 2  . Sepsis  [facility])     Past Surgical History  Procedure Laterality Date  . Lithotripsy    . Lithotripsy    . Renal artery stent     Family History:  Family History  Problem Relation Age of Onset  . Diabetes Mellitus II Mother   . Diabetes Mellitus II Brother   . Depression Brother   . Diabetes Mellitus II Daughter    Family Psychiatric  History: denies Social History:  History  Alcohol Use No     History  Drug Use  . 3.00 per week  . Special: Marijuana    Social History   Social History  . Marital Status: Married    Spouse Name: N/A  . Number of Children: N/A  . Years of Education: N/A   Social History Main Topics  . Smoking status: Current Every Day Smoker -- 1.00 packs/day  . Smokeless tobacco: None  . Alcohol Use: No  . Drug Use: 3.00 per week    Special: Marijuana  . Sexual Activity: Not Asked   Other Topics Concern  . None   Social History Narrative   Additional Social History:    Pain Medications: n/a Prescriptions: See PTA list Over the Counter: n/a History of alcohol / drug use?: Yes Longest period of sobriety (when/how long): 8 years Withdrawal Symptoms: Other (Comment) (no withdrawal symptoms) Name of Substance 1: Marijuana 1 - Age of First Use: 14 1 - Amount (size/oz): 1 gram 1 - Frequency: 1 week used about every 2 days 1 - Duration: for the last year 1 - Last Use / Amount: 2 weeks ago Name of Substance 2: nicotine 2 - Age of First Use: 14 2 - Amount (size/oz): 1 pack 2 - Frequency: daily 2 - Duration: 1 year 2 - Last Use / Amount: today Name of Substance 3: alcohol 3 - Age of First Use: 14 3 - Amount (size/oz): "a few beers or glasses of wine" 3 - Frequency: occasionally - every few months 3 - Last Use / Amount: September 2016 Name of Substance 4: Nicotine 4 - Age of First Use: 16 4 - Amount (size/oz): 1 pack a day 4 - Frequency: daily 4 - Last Use / Amount: yesterday            Allergies:  No Known Allergies Lab Results:  Results  for orders placed or performed during the hospital encounter of 03/06/15 (from the past 48 hour(s))  Urine rapid drug screen (hosp performed) (Not at [facility])     Status: Abnormal   Collection Time: 03/06/15 11:18 PM  Result Value Ref Range   Opiates POSITIVE (A) NONE DETECTED   Cocaine NONE DETECTED NONE DETECTED   Benzodiazepines NONE DETECTED NONE DETECTED   Amphetamines NONE DETECTED NONE DETECTED   Tetrahydrocannabinol NONE DETECTED NONE DETECTED   Barbiturates NONE DETECTED NONE DETECTED    Comment:        DRUG SCREEN FOR MEDICAL PURPOSES ONLY.  IF CONFIRMATION IS NEEDED FOR ANY PURPOSE, NOTIFY LAB WITHIN 5 DAYS.        LOWEST DETECTABLE LIMITS FOR URINE DRUG  SCREEN Drug Class       Cutoff (ng/mL) Amphetamine      1000 Barbiturate      200 Benzodiazepine   258 Tricyclics       527 Opiates          300 Cocaine          300 THC              50   Comprehensive metabolic panel     Status: None   Collection Time: 03/06/15 11:26 PM  Result Value Ref Range   Sodium 137 135 - 145 mmol/L   Potassium 4.2 3.5 - 5.1 mmol/L   Chloride 101 101 - 111 mmol/L   CO2 28 22 - 32 mmol/L   Glucose, Bld 96 65 - 99 mg/dL   BUN 9 6 - 20 mg/dL   Creatinine, Ser 0.95 0.61 - 1.24 mg/dL   Calcium 9.2 8.9 - 10.3 mg/dL   Total Protein 6.9 6.5 - 8.1 g/dL   Albumin 4.0 3.5 - 5.0 g/dL   AST 31 15 - 41 U/L   ALT 25 17 - 63 U/L   Alkaline Phosphatase 43 38 - 126 U/L   Total Bilirubin 0.6 0.3 - 1.2 mg/dL   GFR calc non Af Amer >60 >60 mL/min   GFR calc Af Amer >60 >60 mL/min    Comment: (NOTE) The eGFR has been calculated using the CKD EPI equation. This calculation has not been validated in all clinical situations. eGFR's persistently <60 mL/min signify possible Chronic Kidney Disease.    Anion gap 8 5 - 15  Ethanol (ETOH)     Status: None   Collection Time: 03/06/15 11:26 PM  Result Value Ref Range   Alcohol, Ethyl (B) <5 <5 mg/dL    Comment:        LOWEST DETECTABLE LIMIT FOR SERUM  ALCOHOL IS 5 mg/dL FOR MEDICAL PURPOSES ONLY   Salicylate level     Status: None   Collection Time: 03/06/15 11:26 PM  Result Value Ref Range   Salicylate Lvl <7.8 2.8 - 30.0 mg/dL  Acetaminophen level     Status: None   Collection Time: 03/06/15 11:26 PM  Result Value Ref Range   Acetaminophen (Tylenol), Serum 10 10 - 30 ug/mL    Comment:        THERAPEUTIC CONCENTRATIONS VARY SIGNIFICANTLY. A RANGE OF 10-30 ug/mL MAY BE AN EFFECTIVE CONCENTRATION FOR MANY PATIENTS. HOWEVER, SOME ARE BEST TREATED AT CONCENTRATIONS OUTSIDE THIS RANGE. ACETAMINOPHEN CONCENTRATIONS >150 ug/mL AT 4 HOURS AFTER INGESTION AND >50 ug/mL AT 12 HOURS AFTER INGESTION ARE OFTEN ASSOCIATED WITH TOXIC REACTIONS.   CBC     Status: None   Collection Time: 03/06/15 11:26 PM  Result Value Ref Range   WBC 10.4 4.0 - 10.5 K/uL   RBC 5.25 4.22 - 5.81 MIL/uL   Hemoglobin 15.6 13.0 - 17.0 g/dL   HCT 46.4 39.0 - 52.0 %   MCV 88.4 78.0 - 100.0 fL   MCH 29.7 26.0 - 34.0 pg   MCHC 33.6 30.0 - 36.0 g/dL   RDW 13.6 11.5 - 15.5 %   Platelets 247 150 - 242 K/uL    Metabolic Disorder Labs:  No results found for: HGBA1C, MPG No results found for: PROLACTIN No results found for: CHOL, TRIG, HDL, CHOLHDL, VLDL, LDLCALC  Current Medications: Current Facility-Administered Medications  Medication Dose Route Frequency Provider Last Rate Last Dose  . acetaminophen (TYLENOL) tablet 650 mg  650 mg  Oral Q6H PRN Kerrie Buffalo, NP      . alum & mag hydroxide-simeth (MAALOX/MYLANTA) 200-200-20 MG/5ML suspension 30 mL  30 mL Oral Q4H PRN Kerrie Buffalo, NP      . amoxicillin (AMOXIL) capsule 500 mg  500 mg Oral 3 times per day Kerrie Buffalo, NP   500 mg at 03/07/15 1417  . ARIPiprazole (ABILIFY) tablet 15 mg  15 mg Oral Daily Kerrie Buffalo, NP   15 mg at 03/07/15 1417  . gabapentin (NEURONTIN) capsule 300 mg  300 mg Oral TID Kerrie Buffalo, NP      . HYDROcodone-acetaminophen (NORCO/VICODIN) 5-325 MG per tablet 1-2 tablet   1-2 tablet Oral Q6H PRN Kerrie Buffalo, NP   1 tablet at 03/07/15 1416  . hydrOXYzine (ATARAX/VISTARIL) tablet 25 mg  25 mg Oral TID PRN Kerrie Buffalo, NP   25 mg at 03/07/15 1421  . magnesium hydroxide (MILK OF MAGNESIA) suspension 30 mL  30 mL Oral Daily PRN Kerrie Buffalo, NP      . pantoprazole (PROTONIX) EC tablet 40 mg  40 mg Oral Daily Kerrie Buffalo, NP   40 mg at 03/07/15 1417  . prazosin (MINIPRESS) capsule 2 mg  2 mg Oral QHS Kerrie Buffalo, NP      . traZODone (DESYREL) tablet 100 mg  100 mg Oral QHS Kerrie Buffalo, NP       PTA Medications: Prescriptions prior to admission  Medication Sig Dispense Refill Last Dose  . acetaminophen (TYLENOL) 500 MG tablet Take 500 mg by mouth every 6 (six) hours as needed for mild pain or moderate pain.    unk  . ARIPiprazole (ABILIFY) 15 MG tablet Take 1 tablet (15 mg total) by mouth daily. For mood control 30 tablet 0 Past Week at Unknown time  . citalopram (CELEXA) 40 MG tablet Take 1 tablet (40 mg total) by mouth daily. For depression 30 tablet 0 Past Week at Unknown time  . clonazePAM (KLONOPIN) 1 MG tablet Take 1 tablet (1 mg total) by mouth 2 (two) times daily as needed (anxiety). (Patient taking differently: Take 1 mg by mouth 2 (two) times daily as needed for anxiety. ) 60 tablet 0 03/06/2015 at Unknown time  . divalproex (DEPAKOTE ER) 250 MG 24 hr tablet Take 3 tablets (750 mg total) by mouth daily. For mood stabilization 90 tablet 0 Past Week at Unknown time  . doxycycline (VIBRAMYCIN) 50 MG capsule Take 2 capsules (100 mg total) by mouth 2 (two) times daily. 28 capsule 0 Past Week at Unknown time  . gabapentin (NEURONTIN) 300 MG capsule Take 1 capsule (300 mg total) by mouth 3 (three) times daily. For pain/agitation 90 capsule 0 Past Week at Unknown time  . HYDROcodone-acetaminophen (NORCO/VICODIN) 5-325 MG tablet Take 2 tablets by mouth every 6 (six) hours as needed. (Patient taking differently: Take 2 tablets by mouth every 6 (six) hours  as needed for moderate pain. ) 6 tablet 0 Past Week at Unknown time  . hydrOXYzine (ATARAX/VISTARIL) 25 MG tablet Take 1 tablet (25 mg total) by mouth 3 (three) times daily as needed for anxiety. 45 tablet 0 Past Week at Unknown time  . mirtazapine (REMERON) 15 MG tablet Take 1 tablet (15 mg total) by mouth at bedtime. For depression/insomnia 30 tablet 0 Past Week at Unknown time  . omeprazole (PRILOSEC) 40 MG capsule Take 1 capsule (40 mg total) by mouth daily. For acid reflux   Past Week at Unknown time  . ondansetron (ZOFRAN) 4 MG tablet Take  1 tablet (4 mg total) by mouth every 6 (six) hours. 12 tablet 0 03/06/2015 at Unknown time  . prazosin (MINIPRESS) 2 MG capsule Take 1 capsule (2 mg total) by mouth at bedtime. For nightmares 30 capsule 0 Past Week at Unknown time  . pregabalin (LYRICA) 75 MG capsule Take 1 capsule (75 mg total) by mouth 3 (three) times daily. For pain 90 capsule 0 Past Week at Unknown time  . traZODone (DESYREL) 100 MG tablet Take 1 tablet (100 mg total) by mouth at bedtime. For sleep 30 tablet 0 Past Week at Unknown time    Musculoskeletal: Strength & Muscle Tone: within normal limits Gait & Station: normal Patient leans: N/A  Psychiatric Specialty Exam: Physical Exam  Skin:  Dressing with packing intact left underarm.  S/P I&D.    ROS  Blood pressure 124/65, pulse 79, temperature 98.4 F (36.9 C), temperature source Oral, resp. rate 18, height 5' 8" (1.727 m), weight 78.472 kg (173 lb), SpO2 98 %.Body mass index is 26.31 kg/(m^2).  General Appearance: Neat  Eye Contact::  Good  Speech:  Clear and Coherent  Volume:  Normal  Mood:  Anxious  Affect:  Congruent  Thought Process:  Coherent  Orientation:  Full (Time, Place, and Person)  Thought Content:  Rumination  Suicidal Thoughts:  Yes.  without intent/plan  Homicidal Thoughts:  No  Memory:  Immediate;   Fair Recent;   Fair Remote;   Fair  Judgement:  Fair  Insight:  Fair  Psychomotor Activity:   Decreased  Concentration:  Good  Recall:  Good  Fund of Knowledge:Good  Language: Good  Akathisia:  Yes  Handed:  Right  AIMS (if indicated):     Assets:  Resilience  ADL's:  Intact  Cognition: WNL  Sleep:      Treatment Plan Summary: Daily contact with patient to assess and evaluate symptoms and progress in treatment and Medication management  Observation Level/Precautions:  15 minute checks  Laboratory:  per ED  Psychotherapy:  group  Medications:  As per medlist  Consultations:  As needed  Discharge Concerns:  safety  Estimated LOS:  5-7 days  Other:     I certify that inpatient services furnished can reasonably be expected to improve the patient's condition.   Freda Munro May Agustin AGNP-BC 11/1/20163:32 PM Case reviewed with NP and patient seen  Agree with NP Note 34 year old male, who had a recent episode of sepsis stemming from an axillary abscess. Due to this he ws admitted to a medical unit for a few days, and recently discharged. States that he has been off his regularly prescribed psychiatric medications for a number of fays leading to increased anxiety, depression, mood swings. He had started to experience some suicidal ideations, but did not act out on them. Due to this he spoke with his outpatient psychiatrist who recommended inpatient admission Patient states he had been doing well on his recent psychiatric medication regimen, and was not having side effects- he reports having been on Neurontin, Abilify, Neurontin, Minipress . Of note, he has a prior psychiatric history and has had previous admissions, most recently about a month ago for depression, suicidal ideations and anxiety. He has been diagnosed with MDD, PTSD, and has a history of Cannabis Dependence. Denies any recent alcohol abuse . Dx- PTSD by history, Major Depression, Cannabis Dependence  Plan - Inpatient admission, restart Celexa at 20 mgrs QDAY, Neurontin at 300 mgrs TID, Abilify 15 mgrs QDAY

## 2015-03-07 NOTE — [facility] Group Notes (Signed)
Adult Psychoeducational Group Note  Date:  03/07/2015 Time:  8:59 PM  Group Topic/Focus:  Wrap-Up Group:   The focus of this group is to help patients review their daily goal of treatment and discuss progress on daily workbooks.  Participation Level:  Active  Participation Quality:  Appropriate and Attentive  Affect:  Appropriate  Cognitive:  Alert and Appropriate  Insight: Appropriate  Engagement in Group:  Engaged  Modes of Intervention:  Discussion and Education  Additional Comments:  Pt stated his day was good  Shelly BombardGarner, Tanya D 03/07/2015, 8:59 PM

## 2015-03-07 NOTE — [facility] Suicide Risk Assessment (Addendum)
[facility]   Nursing information obtained from:   patient and chart  Demographic factors:   married, has three children, currently employed, Engineer, mininghome inspector  Current Mental Status:   see below  Loss Factors:   recent severe infection , not taking psychiatric medications recently  Historical Factors:   several psychiatric admissions in the past, prior history of suicide attempt by jumping off a bridge 2 years ago , history of cannabis abuse  Risk Reduction Factors:   resilience, sense of responsibility to family Total Time spent with patient: 45 minutes Principal Problem: Depression Diagnosis:   Patient Active Problem List   Diagnosis Date Noted  . Depression [F32.9] 03/07/2015  . Sepsis ([facility]) [A41.9] 03/02/2015  . Thrombocytopenia ([facility]) [D69.6] 03/02/2015  . Abscess [L02.91]   . Depression with suicidal ideation [F32.9, R45.851] 02/06/2015  . Polysubstance abuse [F19.10] 12/21/2014  . Substance induced mood disorder ([facility]) [F19.94] 12/21/2014  . PTSD (post-traumatic stress disorder) [F43.10] 12/21/2014  . MDD (major depressive disorder), recurrent severe, without psychosis ([facility]) [F33.2] 12/21/2014     Continued Clinical Symptoms:  Alcohol Use Disorder Identification Test Final Score (AUDIT): 0 The "Alcohol Use Disorders Identification Test", Guidelines for Use in Primary Care, Second Edition.  World Science writerHealth Organization [facility](WHO). Score between 0-7:  no or low risk or alcohol related problems. Score between 8-15:  moderate risk of alcohol related problems. Score between 16-19:  high risk of alcohol related problems. Score 20 or above:  warrants further diagnostic evaluation for alcohol dependence and treatment.   CLINICAL FACTORS:  34 year old male, who had a recent episode of sepsis stemming from an axillary abscess. Due to this he ws admitted to a medical unit for a few days, and recently discharged. States that he has been off his regularly prescribed  psychiatric medications for a number of fays leading to increased anxiety, depression, mood swings.  He had started to experience some suicidal ideations, but did not act out on them. Due to this he spoke with his outpatient psychiatrist who recommended inpatient admission Patient states he had been doing well on his recent psychiatric medication regimen, and was not having side effects- he reports having been on Neurontin, Abilify, Neurontin, Minipress . Of note, he has a prior psychiatric history and has had previous admissions, most recently about a month ago for depression, suicidal ideations and anxiety. He has been diagnosed with MDD, PTSD, and has a history of Cannabis Dependence. Denies any recent alcohol abuse . Dx- PTSD by history, Major Depression, Cannabis Dependence  Plan - Inpatient admission, restart Celexa at 20 mgrs QDAY, Neurontin at 300 mgrs TID, Abilify 15 mgrs QDAY     Musculoskeletal: Strength & Muscle Tone: within normal limits Gait & Station: normal Patient leans: N/A  Psychiatric Specialty Exam: Physical Exam  ROS  Blood pressure 124/65, pulse 79, temperature 98.4 F (36.9 C), temperature source Oral, resp. rate 18, height 5\' 8"  (1.727 m), weight 173 lb (78.472 kg), SpO2 98 %.Body mass index is 26.31 kg/(m^2).  General Appearance: Fairly Groomed  Patent attorneyye Contact::  Good  Speech:  Normal Rate  Volume:  Normal  Mood:  Anxious  Affect:  Congruent and Constricted  Thought Process:  Linear  Orientation:  Other:  fully alert and attentive   Thought Content:  no hallucinations, no delusions   Suicidal Thoughts:  No- denies any current suicidal or self injurious ideations at this time .   Homicidal Thoughts:  No  Memory:  recent and remote  grossly intact \  Judgement:  Fair  Insight:  Fair  Psychomotor Activity:  Normal  Concentration:  Good  Recall:  Good  Fund of Knowledge:Good  Language: Good  Akathisia:  Negative  Handed:  Right  AIMS (if indicated):      Assets:  Communication Skills Desire for Improvement Resilience  Sleep:     Cognition: WNL  ADL's:  Intact     COGNITIVE FEATURES THAT CONTRIBUTE TO RISK:  Closed-mindedness and Loss of executive function    SUICIDE RISK:   Moderate:  Frequent suicidal ideation with limited intensity, and duration, some specificity in terms of plans, no associated intent, good self-control, limited dysphoria/symptomatology, some risk factors present, and identifiable protective factors, including available and accessible social support.  PLAN OF CARE: Patient will be admitted to inpatient psychiatric unit for stabilization and safety. Will provide and encourage milieu participation. Provide medication management and maked adjustments as needed.  Will follow daily.    Medical Decision Making:  Review of Psycho-Social Stressors (1), Review or order clinical lab tests (1), Established Problem, Worsening (2) and Review of New Medication or Change in Dosage (2)  I certify that inpatient services furnished can reasonably be expected to improve the patient's condition.   Vonda Harth 03/07/2015, 4:43 PM

## 2015-03-07 NOTE — ED Notes (Signed)
Pt requesting something else for pain. Primary RN and MD to be made aware.

## 2015-03-07 NOTE — Plan of Care (Signed)
Problem: Alteration in mood & ability to function due to Goal: STG-Patient will attend groups Outcome: Progressing Pt attended group this evening     

## 2015-03-07 NOTE — ED Notes (Signed)
Pt aware Dr Dalene SeltzerSchlossman will assess his surgical site as requested.

## 2015-03-07 NOTE — ED Notes (Signed)
Pt on phone talking w/spouse.

## 2015-03-07 NOTE — ED Notes (Signed)
Ambulatory to nurses' desk - [facility]

## 2015-03-07 NOTE — ED Notes (Signed)
TTS in process 
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
## 2015-03-07 NOTE — ED Notes (Signed)
Attempted to give pt pain med; pt refused states he can have his wife bring him his home oxycodone; Pt was told why Md will not give him a narcotic; Pt got very agitated and up set and started ripping cords off; Security called for stand by assist; Md notified to reassess and to speak with pt about pain; Pt denies SI at this time; states he said he he had SI at some point during the day but not presently;Md currently at bedside

## 2015-03-07 NOTE — ED Notes (Signed)
telepsych machine placed at the bedside.

## 2015-03-07 NOTE — ED Notes (Signed)
MD at bedside. 

## 2015-03-07 NOTE — Progress Notes (Signed)
Patient is a 34 year old male admitted to the unit voluntarily do to increased depression with SI.  Upon admission patient denies SI, HI, AVH and was able to contract for safety.  Patient did report increased anxiety and a feeling of impending doom.  Patient was irritable and becoming agitated, but he attributed his mood to increased pain due to surgical wound.  Consulted with physician about pain and patient was ordered pain medications which helped to resolve his pain issues. Patient was admitted to the unit without and incident and is pleasant and cooperative.  Patient able to contract for safety.

## 2015-03-07 NOTE — ED Notes (Signed)
[facility] [place] - AC will call to notify time may be transported. RN to ensure pt able to eat breakfast w/no N/V.

## 2015-03-08 MED ORDER — GABAPENTIN 300 MG PO CAPS
300.0000 mg | ORAL_CAPSULE | Freq: Every morning | ORAL | Status: DC
  Administered 2015-03-09 – 2015-03-10 (×2): 300 mg via ORAL
  Filled 2015-03-08 (×2): qty 1
  Filled 2015-03-08: qty 21
  Filled 2015-03-08: qty 1

## 2015-03-08 MED ORDER — CLONAZEPAM 1 MG PO TABS
1.0000 mg | ORAL_TABLET | Freq: Every day | ORAL | Status: DC
Start: 2015-03-08 — End: 2015-03-08

## 2015-03-08 MED ORDER — CLONAZEPAM 1 MG PO TABS: 1.0000 mg | ORAL_TABLET | Freq: Two times a day (BID) | ORAL | Status: DC

## 2015-03-08 MED ORDER — GABAPENTIN 300 MG PO CAPS
600.0000 mg | ORAL_CAPSULE | Freq: Every day | ORAL | Status: DC
  Administered 2015-03-08 – 2015-03-09 (×2): 600 mg via ORAL
  Filled 2015-03-08 (×4): qty 2

## 2015-03-08 MED ORDER — CLONAZEPAM 1 MG PO TABS
1.0000 mg | ORAL_TABLET | Freq: Two times a day (BID) | ORAL | Status: DC | PRN
  Administered 2015-03-08 – 2015-03-10 (×5): 1 mg via ORAL
  Filled 2015-03-08 (×5): qty 1

## 2015-03-08 NOTE — Progress Notes (Signed)
D:Patient in the dayroom on approach.  Patient states he had a good day.  Patient states his goal for today was to get his medications corrected.  Patient states he met his goal.  Patient denies SI/HI and denies AVH. A: Staff to monitor Q 15 mins for safety.  Encouragement and support offered.  Scheduled medications administered per orders.  Ibuprofen administered prn for pain. Klonopin administered prn for anxiety.  R: Patient remains safe on the unit.  Patient attended group tonight.  Patient visible on the unit and interacting with peers.  Patient taking administered medications.

## 2015-03-08 NOTE — Progress Notes (Signed)
D- Patient has been attending groups, participating in unit activities.  Patient has had a bright affect and has been agreeable to treatment this shift. Patient has had complaints about arm pain due to recent incision and drainage of an abscess.  but has been trying to use an NSAID as opposed to an opiate analgesic to relieve his pain.  Patient has been able to maintain to a tolerable level.   A-  Assess patient for safety, offer medications as prescribed, assess for effectiveness and potential medication side effects. Assess for surgical site pain and signs and symptoms of infection.   R- Patient has been able to contract for safety, patient was compliant with medications and therapy.  Patient had no incidents of behavioral dyscontrol.
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
## 2015-03-08 NOTE — [facility] Group Notes (Signed)
[facility] LCSW Group Therapy 03/08/2015 1:15 PM  Type of Therapy: Group Therapy- Emotion Regulation  Participation Level: Active   Participation Quality:  Appropriate  Affect: Appropriate  Cognitive: Alert and Oriented   Insight:  Developing/Improving  Engagement in Therapy: Developing/Improving and Engaged   Modes of Intervention: Clarification, Confrontation, Discussion, Education, Exploration, Limit-setting, Orientation, Problem-solving, Rapport Building, Dance movement psychotherapisteality Testing, Socialization and Support  Summary of Progress/Problems: The topic for group today was emotional regulation. This group focused on both positive and negative emotion identification and allowed group members to process ways to identify feelings, regulate negative emotions, and find healthy ways to manage internal/external emotions. Group members were asked to reflect on a time when their reaction to an emotion led to a negative outcome and explored how alternative responses using emotion regulation would have benefited them. Group members were also asked to discuss a time when emotion regulation was utilized when a negative emotion was experienced. Pt continues to be active in group discussion, describing his anger as secondary to other emotions such as fear and sadness. 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 He described feeling entitled to his own emotions and does not like it when others "tell him how to feel." Pt expressed that he would like to become more aware of his feelings at discharge in order to address them more effectively.   Ricardo CordialLauren Carter, LCSWA 03/08/2015 3:59 PM

## 2015-03-08 NOTE — Progress Notes (Addendum)
[facility] MD Progress Note  03/08/2015 5:11 PM Ricardo Sims  MRN:  [id number] Subjective:   Patient reports partially improved mood . Still feels depressed and vaguely irritable, which at this time he attributes at least in part to insomnia. States he was not able to sleep well in spite of Trazodone. Patient reports he has been on Klonopin 1 mgr BID for about one year, and that he feels this medication has helped his anxiety and insomnia the best thus far . At this time does not endorse any clear symptoms of BZD withdrawal, but reports increased anxiety, insomnia, and a subjective sense of agitation off this medication. He reports awareness that this medication is addictive, and states he is wanting to taper off gradually as outpatient , but that at this time he does not want to detox from BZD , as he feels he functions better on it . Objective : I have discussed case with treatment team and have met with patient . Patient presenting with partially improved mood but still reporting some lingering depression, anxiety. Insomnia and anxiety are his main concerns, symptoms at this time. Visible on unit, going to some groups , no disruptive behaviors on unit. Denies medication side effects.   Principal Problem: Depression Diagnosis:   Patient Active Problem List   Diagnosis Date Noted  . Depression [F32.9] 03/07/2015  . Sepsis ([place]) [A41.9] 03/02/2015  . Thrombocytopenia ([place]) [D69.6] 03/02/2015  . Abscess [L02.91]   . Depression with suicidal ideation [F32.9, R45.851] 02/06/2015  . Polysubstance abuse [F19.10] 12/21/2014  . Substance induced mood disorder ([place]) [F19.94] 12/21/2014  . PTSD (post-traumatic stress disorder) [F43.10] 12/21/2014  . MDD (major depressive disorder), recurrent severe, without psychosis ([place]) [F33.2] 12/21/2014   Total Time spent with patient: 20 minutes     Past Medical History:  Past Medical History  Diagnosis Date  . Rib fracture   . Orbital fracture ([place])   .  Depression   . Anxiety   . Kidney stone   . PTSD (post-traumatic stress disorder)   . Nephrolithiasis   . H/O suicide attempt     x 2  . Sepsis [facility])     Past Surgical History  Procedure Laterality Date  . Lithotripsy    . Lithotripsy    . Renal artery stent     Family History:  Family History  Problem Relation Age of Onset  . Diabetes Mellitus II Mother   . Diabetes Mellitus II Brother   . Depression Brother   . Diabetes Mellitus II Daughter    Social History:  History  Alcohol Use No     History  Drug Use  . 3.00 per week  . Special: Marijuana    Social History   Social History  . Marital Status: Married    Spouse Name: N/A  . Number of Children: N/A  . Years of Education: N/A   Social History Main Topics  . Smoking status: Current Every Day Smoker -- 1.00 packs/day  . Smokeless tobacco: None  . Alcohol Use: No  . Drug Use: 3.00 per week    Special: Marijuana  . Sexual Activity: Not Asked   Other Topics Concern  . None   Social History Narrative   Additional Social History:    Pain Medications: n/a Prescriptions: See PTA list Over the Counter: n/a History of alcohol / drug use?: Yes Longest period of sobriety (when/how long): 8 years Withdrawal Symptoms: Other (Comment) (no withdrawal symptoms) Name of Substance 1: Marijuana 1 - Age  of First Use: 14 1 - Amount (size/oz): 1 gram 1 - Frequency: 1 week used about every 2 days 1 - Duration: for the last year 1 - Last Use / Amount: 2 weeks ago Name of Substance 2: nicotine 2 - Age of First Use: 14 2 - Amount (size/oz): 1 pack 2 - Frequency: daily 2 - Duration: 1 year 2 - Last Use / Amount: today Name of Substance 3: alcohol 3 - Age of First Use: 14 3 - Amount (size/oz): "a few beers or glasses of wine" 3 - Frequency: occasionally - every few months 3 - Last Use / Amount: September 2016 Name of Substance 4: Nicotine 4 - Age of First Use: 16 4 - Amount (size/oz): 1 pack a day 4 - Frequency:  daily 4 - Last Use / Amount: yesterday            Sleep: Poor  Appetite:  Good  Current Medications: Current Facility-Administered Medications  Medication Dose Route Frequency Provider Last Rate Last Dose  . alum & mag hydroxide-simeth (MAALOX/MYLANTA) 200-200-20 MG/5ML suspension 30 mL  30 mL Oral Q4H PRN Kerrie Buffalo, NP      . amoxicillin (AMOXIL) capsule 500 mg  500 mg Oral 3 times per day Kerrie Buffalo, NP   500 mg at 03/08/15 1305  . ARIPiprazole (ABILIFY) tablet 15 mg  15 mg Oral Daily Kerrie Buffalo, NP   15 mg at 03/08/15 0858  . citalopram (CELEXA) tablet 20 mg  20 mg Oral Daily Jenne Campus, MD   20 mg at 03/08/15 0858  . clonazePAM (KLONOPIN) tablet 1 mg  1 mg Oral BID Jenne Campus, MD      . Derrill Memo ON 03/09/2015] gabapentin (NEURONTIN) capsule 300 mg  300 mg Oral q morning - 10a Myer Peer Cobos, MD      . gabapentin (NEURONTIN) capsule 600 mg  600 mg Oral QHS Myer Peer Cobos, MD      . hydrOXYzine (ATARAX/VISTARIL) tablet 25 mg  25 mg Oral TID PRN Kerrie Buffalo, NP   25 mg at 03/07/15 2235  . ibuprofen (ADVIL,MOTRIN) tablet 600 mg  600 mg Oral Q6H PRN Niel Hummer, NP   600 mg at 03/08/15 1306  . magnesium hydroxide (MILK OF MAGNESIA) suspension 30 mL  30 mL Oral Daily PRN Kerrie Buffalo, NP      . nicotine (NICODERM CQ - dosed in mg/24 hours) patch 21 mg  21 mg Transdermal Daily Jenne Campus, MD   21 mg at 03/08/15 0859  . pantoprazole (PROTONIX) EC tablet 40 mg  40 mg Oral Daily Kerrie Buffalo, NP   40 mg at 03/08/15 0857  . prazosin (MINIPRESS) capsule 2 mg  2 mg Oral QHS Kerrie Buffalo, NP   2 mg at 03/07/15 2235    Lab Results:  Results for orders placed or performed during the hospital encounter of 03/06/15 (from the past 48 hour(s))  Urine rapid drug screen (hosp performed) (Not at [facility])     Status: Abnormal   Collection Time: 03/06/15 11:18 PM  Result Value Ref Range   Opiates POSITIVE (A) NONE DETECTED   Cocaine NONE DETECTED NONE DETECTED    Benzodiazepines NONE DETECTED NONE DETECTED   Amphetamines NONE DETECTED NONE DETECTED   Tetrahydrocannabinol NONE DETECTED NONE DETECTED   Barbiturates NONE DETECTED NONE DETECTED    Comment:        DRUG SCREEN FOR MEDICAL PURPOSES ONLY.  IF CONFIRMATION IS NEEDED FOR ANY PURPOSE, NOTIFY LAB  WITHIN 5 DAYS.        LOWEST DETECTABLE LIMITS FOR URINE DRUG SCREEN Drug Class       Cutoff (ng/mL) Amphetamine      1000 Barbiturate      200 Benzodiazepine   856 Tricyclics       314 Opiates          300 Cocaine          300 THC              50   Comprehensive metabolic panel     Status: None   Collection Time: 03/06/15 11:26 PM  Result Value Ref Range   Sodium 137 135 - 145 mmol/L   Potassium 4.2 3.5 - 5.1 mmol/L   Chloride 101 101 - 111 mmol/L   CO2 28 22 - 32 mmol/L   Glucose, Bld 96 65 - 99 mg/dL   BUN 9 6 - 20 mg/dL   Creatinine, Ser 0.95 0.61 - 1.24 mg/dL   Calcium 9.2 8.9 - 10.3 mg/dL   Total Protein 6.9 6.5 - 8.1 g/dL   Albumin 4.0 3.5 - 5.0 g/dL   AST 31 15 - 41 U/L   ALT 25 17 - 63 U/L   Alkaline Phosphatase 43 38 - 126 U/L   Total Bilirubin 0.6 0.3 - 1.2 mg/dL   GFR calc non Af Amer >60 >60 mL/min   GFR calc Af Amer >60 >60 mL/min    Comment: (NOTE) The eGFR has been calculated using the CKD EPI equation. This calculation has not been validated in all clinical situations. eGFR's persistently <60 mL/min signify possible Chronic Kidney Disease.    Anion gap 8 5 - 15  Ethanol (ETOH)     Status: None   Collection Time: 03/06/15 11:26 PM  Result Value Ref Range   Alcohol, Ethyl (B) <5 <5 mg/dL    Comment:        LOWEST DETECTABLE LIMIT FOR SERUM ALCOHOL IS 5 mg/dL FOR MEDICAL PURPOSES ONLY   Salicylate level     Status: None   Collection Time: 03/06/15 11:26 PM  Result Value Ref Range   Salicylate Lvl <9.7 2.8 - 30.0 mg/dL  Acetaminophen level     Status: None   Collection Time: 03/06/15 11:26 PM  Result Value Ref Range   Acetaminophen (Tylenol), Serum  10 10 - 30 ug/mL    Comment:        THERAPEUTIC CONCENTRATIONS VARY SIGNIFICANTLY. A RANGE OF 10-30 ug/mL MAY BE AN EFFECTIVE CONCENTRATION FOR MANY PATIENTS. HOWEVER, SOME ARE BEST TREATED AT CONCENTRATIONS OUTSIDE THIS RANGE. ACETAMINOPHEN CONCENTRATIONS >150 ug/mL AT 4 HOURS AFTER INGESTION AND >50 ug/mL AT 12 HOURS AFTER INGESTION ARE OFTEN ASSOCIATED WITH TOXIC REACTIONS.   CBC     Status: None   Collection Time: 03/06/15 11:26 PM  Result Value Ref Range   WBC 10.4 4.0 - 10.5 K/uL   RBC 5.25 4.22 - 5.81 MIL/uL   Hemoglobin 15.6 13.0 - 17.0 g/dL   HCT 46.4 39.0 - 52.0 %   MCV 88.4 78.0 - 100.0 fL   MCH 29.7 26.0 - 34.0 pg   MCHC 33.6 30.0 - 36.0 g/dL   RDW 13.6 11.5 - 15.5 %   Platelets 247 150 - 400 K/uL    Physical Findings: AIMS: Facial and Oral Movements Muscles of Facial Expression: None, normal Lips and Perioral Area: None, normal Jaw: None, normal Tongue: None, normal,Extremity Movements Upper (arms, wrists, hands, fingers): None, normal Lower (legs,  knees, ankles, toes): None, normal, Trunk Movements Neck, shoulders, hips: None, normal, Overall Severity Severity of abnormal movements (highest score from questions above): None, normal Incapacitation due to abnormal movements: None, normal Patient's awareness of abnormal movements (rate only patient's report): No Awareness, Dental Status Current problems with teeth and/or dentures?: No Does patient usually wear dentures?: No  CIWA:  CIWA-Ar Total: 1 COWS:  COWS Total Score: 0  Musculoskeletal: Strength & Muscle Tone: within normal limits Gait & Station: normal Patient leans: N/A  Psychiatric Specialty Exam: ROS poor sleep , no chest pain, no SOB, no vomiting, no rash  Blood pressure 134/72, pulse 111, temperature 98.3 F (36.8 C), temperature source Oral, resp. rate 16, height 5' 8"  (1.727 m), weight 173 lb (78.472 kg), SpO2 98 %.Body mass index is 26.31 kg/(m^2).  General Appearance: Well Groomed   Engineer, water::  Good  Speech:  Normal Rate  Volume:  Normal  Mood:  Depressed and slightly irritable   Affect:  Congruent  Thought Process:  Goal Directed and Linear  Orientation:  Full (Time, Place, and Person)  Thought Content:  no hallucinations, no delusions, not internally preoccupied   Suicidal Thoughts:  No- at this time denies any suicidal ideations or self injurious ideations  Homicidal Thoughts:  No  Memory:  recent and remote grossly intact   Judgement:  Other:  improved   Insight:  Fair  Psychomotor Activity:  Normal- no distal tremors, no diaphoresis, no restlessness   Concentration:  Good  Recall:  Good  Fund of Knowledge:Good  Language: Good  Akathisia:  Negative  Handed:  Right  AIMS (if indicated):     Assets:  Communication Skills Desire for Improvement Resilience Social Support Vocational/Educational  ADL's:  Intact  Cognition: WNL  Sleep:     Assessment - patient presents partially improved compared to admission- at this time reports improved mood and denies any SI . He endorses, however, poor sleep, and significant anxiety, which he attributes to being off Klonopin, which he states he has been taking at 1 mgr BID for more than one year. Of note, currently not presenting with any withdrawal symptoms, except for mild tachycardia, anxiety. We discussed medication issues- patient aware that Klonopin is addictive , sedating, and states he may opt for slow outpatient taper, but at this time not interested in detoxification as he feels this medication is very helpful for him at present .  Treatment Plan Summary: Daily contact with patient to assess and evaluate symptoms and progress in treatment, Medication management, Plan inpatient admission and medications as below  Start Klonopin 1 mgr BID PRN for anxiety, insomnia D/C Trazodone  D/C  Opiate analgesic- patient reports pain now better, agrees does not need further opiate medication at this time Continue Celexa 20  mgrs QDAY  For depression, anxiety Change Neurontin to 300 mgrs QAM and 600 mgrs QHS to address pain, anxiety, and help with insomnia  Continue Minipress 2 mgrs QHS for management of PTSD related nightmares  Continue Abilify 15 mgrs QDAY for depression, anxiety Encourage group , milieu participation to work on coping skills and symptom reduction COBOS, [place] 03/08/2015, 5:11 PM

## 2015-03-08 NOTE — [facility] Group Notes (Signed)
[facility]  03/08/2015 8:45 AM  Participation Quality: Alert, Appropriate and Oriented  Mood/Affect: Irritable  Depression Rating: 3  Anxiety Rating: 3  Thoughts of Suicide: Pt denies SI/HI  Will you contract for safety? Yes  Current AVH: Pt denies  Plan for Discharge/Comments: Pt attended discharge planning group and actively participated in group. CSW discussed suicide prevention education with the group and encouraged them to discuss discharge planning and any relevant barriers. Pt reports that his depression and anxiety are low this morning but that his irritability is high due to little sleep and environmental services coming in to search his room.  Transportation Means: Pt reports access to transportation  Supports: No supports mentioned at this time  Chad CordialLauren Carter, LCSWA 03/08/2015 10:08 AM

## 2015-03-09 MED ORDER — QUETIAPINE FUMARATE 100 MG PO TABS
100.0000 mg | ORAL_TABLET | Freq: Once | ORAL | Status: AC
  Administered 2015-03-09: 100 mg via ORAL
  Filled 2015-03-09 (×2): qty 1

## 2015-03-09 NOTE — Progress Notes (Signed)
Adult Psychoeducational Group Note  Date:  03/09/2015 Time:  0900  Group Topic/Focus:  Orientation:   The focus of this group is to educate the patient on the purpose and policies of crisis stabilization and provide a format to answer questions about their admission.  The group details unit policies and expectations of patients while admitted.  Participation Level:  Did Not Attend  Participation Quality:    Affect:    Cognitive:    Insight:   Engagement in Group:    Modes of Intervention:    Additional Comments:    Claira Jeter L 03/09/2015, 12:51 PM

## 2015-03-09 NOTE — Progress Notes (Signed)
D: Patient was in day room interacting with peers. Denies SI/HI and AVH. Denied pain. Patient had anxious affect. Eye contact was fair. Patient stated he had a "great day." Patient had 2 goals today. The 1st was to speak with his daughter about legal/custody issues. 2nd goal was to "stay up on his medications." Patient stated both goals were met. Patient stated he received some difficult news today. Patient stated his twin brother was attempting to obtain custody of his 46 year old daughter due to his current hospitalization. Patient stated he requested Klonopin to help calm himself. Patient also stated he spoke with daughter about the situation and was able to reassure her. Patient requested Seroquel for sleep. MD ordered a one time dosage.   A: Q 15 min checks maintained. Encouragement and support offered. Medications administered as ordered.   R: Patient went to group. Patient is visible on the unit. Compliant with ordered medications. Patient remains safe on the unit.

## 2015-03-09 NOTE — Progress Notes (Signed)
Patient ID: Ricardo Sims, male   DOB: 12/05/1980, 34 y.o.   MRN: [id number] [facility] MD Progress Note  03/09/2015 5:21 PM Ricardo Sims  MRN:  [id number] Subjective:   Patient reports overall improvement compared to admission. Denies medication side effects. Describes vague irritability and some increased anxiety related to finding out that a brother, with whom his 4 year old daughter is residing at this time, reportedly plans to challenge patient's custody rights. Also, to lesser degree, reports some irritability related to intrusive / loud peers and to not having more outdoor activities  while on inpatient unit. Stresses , however, that he is doing better and reports  overall improvement . Objective : I have discussed case with treatment team and have met with patient . Today denies significant depression. Does report some ongoing /residual anxiety, related to finding out his brother plans to contest his daughter's custody rights in court and also to his  recent acute and severe medical illness ( Sepsis). He remains visible on unit, no disruptive behaviors on unit. He is interacting appropriately with peers . At this time tolerating medications well- denies side effects.   Principal Problem: Depression Diagnosis:   Patient Active Problem List   Diagnosis Date Noted  . Depression [F32.9] 03/07/2015  . Sepsis ([place]) [A41.9] 03/02/2015  . Thrombocytopenia ([place]) [D69.6] 03/02/2015  . Abscess [L02.91]   . Depression with suicidal ideation [F32.9, R45.851] 02/06/2015  . Polysubstance abuse [F19.10] 12/21/2014  . Substance induced mood disorder ([place]) [F19.94] 12/21/2014  . PTSD (post-traumatic stress disorder) [F43.10] 12/21/2014  . MDD (major depressive disorder), recurrent severe, without psychosis ([place]) [F33.2] 12/21/2014   Total Time spent with patient: 20 minutes     Past Medical History:  Past Medical History  Diagnosis Date  . Rib fracture   . Orbital fracture ([place])   . Depression   .  Anxiety   . Kidney stone   . PTSD (post-traumatic stress disorder)   . Nephrolithiasis   . H/O suicide attempt     x 2  . Sepsis [facility])     Past Surgical History  Procedure Laterality Date  . Lithotripsy    . Lithotripsy    . Renal artery stent     Family History:  Family History  Problem Relation Age of Onset  . Diabetes Mellitus II Mother   . Diabetes Mellitus II Brother   . Depression Brother   . Diabetes Mellitus II Daughter    Social History:  History  Alcohol Use No     History  Drug Use  . 3.00 per week  . Special: Marijuana    Social History   Social History  . Marital Status: Married    Spouse Name: N/A  . Number of Children: N/A  . Years of Education: N/A   Social History Main Topics  . Smoking status: Current Every Day Smoker -- 1.00 packs/day  . Smokeless tobacco: None  . Alcohol Use: No  . Drug Use: 3.00 per week    Special: Marijuana  . Sexual Activity: Not Asked   Other Topics Concern  . None   Social History Narrative   Additional Social History:    Pain Medications: n/a Prescriptions: See PTA list Over the Counter: n/a History of alcohol / drug use?: Yes Longest period of sobriety (when/how long): 8 years Withdrawal Symptoms: Other (Comment) (no withdrawal symptoms) Name of Substance 1: Marijuana 1 - Age of First Use: 14 1 - Amount (size/oz): 1 gram 1 - Frequency: 1 week  used about every 2 days 1 - Duration: for the last year 1 - Last Use / Amount: 2 weeks ago Name of Substance 2: nicotine 2 - Age of First Use: 14 2 - Amount (size/oz): 1 pack 2 - Frequency: daily 2 - Duration: 1 year 2 - Last Use / Amount: today Name of Substance 3: alcohol 3 - Age of First Use: 14 3 - Amount (size/oz): "a few beers or glasses of wine" 3 - Frequency: occasionally - every few months 3 - Last Use / Amount: September 2016 Name of Substance 4: Nicotine 4 - Age of First Use: 16 4 - Amount (size/oz): 1 pack a day 4 - Frequency: daily 4 - Last  Use / Amount: yesterday  Sleep:  Improving   Appetite:  Good  Current Medications: Current Facility-Administered Medications  Medication Dose Route Frequency Provider Last Rate Last Dose  . alum & mag hydroxide-simeth (MAALOX/MYLANTA) 200-200-20 MG/5ML suspension 30 mL  30 mL Oral Q4H PRN Kerrie Buffalo, NP      . amoxicillin (AMOXIL) capsule 500 mg  500 mg Oral 3 times per day Kerrie Buffalo, NP   500 mg at 03/09/15 1433  . ARIPiprazole (ABILIFY) tablet 15 mg  15 mg Oral Daily Kerrie Buffalo, NP   15 mg at 03/09/15 0814  . citalopram (CELEXA) tablet 20 mg  20 mg Oral Daily Jenne Campus, MD   20 mg at 03/09/15 0815  . clonazePAM (KLONOPIN) tablet 1 mg  1 mg Oral BID PRN Jenne Campus, MD   1 mg at 03/09/15 1552  . gabapentin (NEURONTIN) capsule 300 mg  300 mg Oral q morning - 10a Jenne Campus, MD   300 mg at 03/09/15 1259  . gabapentin (NEURONTIN) capsule 600 mg  600 mg Oral QHS Myer Peer Cobos, MD   600 mg at 03/08/15 2240  . hydrOXYzine (ATARAX/VISTARIL) tablet 25 mg  25 mg Oral TID PRN Kerrie Buffalo, NP   25 mg at 03/09/15 1301  . ibuprofen (ADVIL,MOTRIN) tablet 600 mg  600 mg Oral Q6H PRN Niel Hummer, NP   600 mg at 03/08/15 2024  . magnesium hydroxide (MILK OF MAGNESIA) suspension 30 mL  30 mL Oral Daily PRN Kerrie Buffalo, NP      . nicotine (NICODERM CQ - dosed in mg/24 hours) patch 21 mg  21 mg Transdermal Daily Jenne Campus, MD   21 mg at 03/09/15 0817  . pantoprazole (PROTONIX) EC tablet 40 mg  40 mg Oral Daily Kerrie Buffalo, NP   40 mg at 03/09/15 0814  . prazosin (MINIPRESS) capsule 2 mg  2 mg Oral QHS Kerrie Buffalo, NP   2 mg at 03/08/15 2240    Lab Results:  No results found for this or any previous visit (from the past 48 hour(s)).  Physical Findings: AIMS: Facial and Oral Movements Muscles of Facial Expression: Moderate Lips and Perioral Area: Moderate Jaw: Moderate Tongue: None, normal,Extremity Movements Upper (arms, wrists, hands, fingers):  Mild Lower (legs, knees, ankles, toes): Mild, Trunk Movements Neck, shoulders, hips: Mild, Overall Severity Severity of abnormal movements (highest score from questions above): None, normal Incapacitation due to abnormal movements: Mild Patient's awareness of abnormal movements (rate only patient's report): No Awareness, Dental Status Current problems with teeth and/or dentures?: No Does patient usually wear dentures?: No  CIWA:  CIWA-Ar Total: 1 COWS:  COWS Total Score: 0  Musculoskeletal: Strength & Muscle Tone: within normal limits Gait & Station: normal Patient leans: N/A  Psychiatric Specialty Exam: ROS poor sleep , no chest pain, no SOB, no vomiting, no rash  Blood pressure 129/76, pulse 92, temperature 98.5 F (36.9 C), temperature source Oral, resp. rate 16, height 5' 8"  (1.727 m), weight 173 lb (78.472 kg), SpO2 98 %.Body mass index is 26.31 kg/(m^2).  General Appearance: Well Groomed  Engineer, water::  Good  Speech:  Normal Rate  Volume:  Normal  Mood:   Improved, not feeling depressed today  Affect:  Congruent, subtly irritable, not constricted   Thought Process:  Goal Directed and Linear  Orientation:  Full (Time, Place, and Person)  Thought Content:  no hallucinations, no delusions, not internally preoccupied   Suicidal Thoughts:  No- at this time denies any suicidal ideations or self injurious ideations  Homicidal Thoughts:  No  Memory:  recent and remote grossly intact   Judgement:  Other:  improved   Insight:  Fair  Psychomotor Activity:  Normal- no distal tremors, no diaphoresis, no restlessness   Concentration:  Good  Recall:  Good  Fund of Knowledge:Good  Language: Good  Akathisia:  Negative  Handed:  Right  AIMS (if indicated):     Assets:  Communication Skills Desire for Improvement Resilience Social Support Vocational/Educational  ADL's:  Intact  Cognition: WNL  Sleep:     Assessment - patient presents  With overall improved mood and today  describes improved mood, less depression. Anxiety symptoms also improved but still present, related to psychosocial stressors, mainly finding out that his brother intends to file court proceeding challenging his custody rights to his daughter, who is currently living with daughter. Denies any SI or any HI, and states he plans to deal with this issue appropriately through court /legal channels. Denies medication side effects . Treatment Plan Summary: Daily contact with patient to assess and evaluate symptoms and progress in treatment, Medication management, Plan inpatient admission and medications as below  Continue Klonopin 1 mgr BID PRN for anxiety, insomnia Continue Celexa 20 mgrs QDAY  For depression, anxiety Continue  Neurontin  300 mgrs QAM and 600 mgrs QHS to address pain, anxiety, and help with insomnia  Continue Minipress 2 mgrs QHS for management of PTSD related nightmares  Continue Abilify 15 mgrs QDAY for depression, anxiety Encourage group , milieu participation to work on coping skills and symptom reduction COBOS, [place] 03/09/2015, 5:21 PM

## 2015-03-09 NOTE — [facility] Suicide Risk Assessment (Signed)
[facility] INPATIENT:  Family/Significant Other Suicide Prevention Education  Suicide Prevention Education:  Contact Attempts: Betha LoaRebekah Arrants, Pt's wife [phone], (name of family member/significant other) has been identified by the patient as the family member/significant other with whom the patient will be residing, and identified as the person(s) who will aid the patient in the event of a mental health crisis.  With written consent from the patient, two attempts were made to provide suicide prevention education, prior to and/or following the patient's discharge.  We were unsuccessful in providing suicide prevention education.  A suicide education pamphlet was given to the patient to share with family/significant other.  Date and time of first attempt: 03/08/15 @ 1:00pm Date and time of second attempt: 03/09/15 @ 8:45am  Elaina Hoopsarter, Evo Aderman M 03/09/2015, 8:48 AM

## 2015-03-09 NOTE — Progress Notes (Signed)
D  This pt has done "OK" today...asking for both prn klonopins, one at 0814 and the 2nd one at 1552...  then fretting and being anxious the rest of the day...  A He completes his daily assessment and on it he wrote he denied SI and he rated his depression, hopelessness and anxiety  As " 4/3/5", respectively . He is very open about how he worries about his future, especially worrying since  He found out today he has more legal issues to deal with.  A HE takes his meds as planned. He Associate Professorcontracts safety. R POC cont

## 2015-03-10 MED ORDER — OMEPRAZOLE 40 MG PO CPDR: 40.0000 mg | DELAYED_RELEASE_CAPSULE | Freq: Every day | ORAL | Status: AC

## 2015-03-10 MED ORDER — HYDROXYZINE HCL 25 MG PO TABS: 25.0000 mg | ORAL_TABLET | Freq: Three times a day (TID) | ORAL | Status: DC | PRN

## 2015-03-10 MED ORDER — NICOTINE 21 MG/24HR TD PT24: 21.0000 mg | MEDICATED_PATCH | Freq: Every day | TRANSDERMAL | Status: DC

## 2015-03-10 MED ORDER — ARIPIPRAZOLE 15 MG PO TABS: 15.0000 mg | ORAL_TABLET | Freq: Every day | ORAL | Status: DC

## 2015-03-10 MED ORDER — CITALOPRAM HYDROBROMIDE 20 MG PO TABS: 20.0000 mg | ORAL_TABLET | Freq: Every day | ORAL | Status: DC

## 2015-03-10 MED ORDER — GABAPENTIN 300 MG PO CAPS: 600.0000 mg | ORAL_CAPSULE | Freq: Every day | ORAL | Status: DC

## 2015-03-10 MED ORDER — CLONAZEPAM 1 MG PO TABS: 1.0000 mg | ORAL_TABLET | Freq: Two times a day (BID) | ORAL | Status: DC | PRN

## 2015-03-10 MED ORDER — PRAZOSIN HCL 2 MG PO CAPS: 2.0000 mg | ORAL_CAPSULE | Freq: Every day | ORAL | Status: DC

## 2015-03-10 MED ORDER — AMOXICILLIN 500 MG PO CAPS: 500.0000 mg | ORAL_CAPSULE | Freq: Three times a day (TID) | ORAL | Status: DC

## 2015-03-10 MED ORDER — GABAPENTIN 300 MG PO CAPS: 300.0000 mg | ORAL_CAPSULE | Freq: Every morning | ORAL | Status: DC

## 2015-03-10 NOTE — Progress Notes (Addendum)
Patient came up to the medication window and wanted to speak with Clinical research associatewriter.  Patient was pacing and appeared visibly angry.  Writer and patient spoke in private and patient stated he confronted his roommate and asked his roommate if he told staff he had a razor blade hidden in his room.  Patient states his roommate responded, "Who told you that and stood up from his bed."  Patient states, "I will punch him in the face if he keeps messing with me." Patient was visibly upset but after talking to writer he was able to calm down.  Patient states'" I have been doing so well and I feel he is trying to mess with me."  Patient states a few days ago he had to be stripped searched and had to have several staff come in his room to look for a razor blade.  Patient states he found out his roommate told staff this information and states he felt violated over misinformation.  Patient states staff told him there was not razor blade found.  This was the first time writer heared of this incident.  Patient did get one dose of Klonopin tonight and it appeared to calm him down.  It was offered to patient to move rooms but he stated, "I am going home tomorrow and I will be calm I am just going to sleep in my own room."  Patient verbally agreed that if he had anymore problems with his roomate he would let staff know.  Patient states he was not going to let his roommate ruin his progress.  Writer gave patient a snack and something to drink at this time.

## 2015-03-10 NOTE — Progress Notes (Signed)
Patient attended karaoke group.  

## 2015-03-10 NOTE — Discharge Summary (Signed)
Physician Discharge Summary Note  Patient:  Ricardo Sims is an 34 y.o., male MRN:  [id number] DOB:  1980-08-25 Patient phone:  [phone] (home)  Patient address:   [address] [place] [address],  Total Time spent with patient: Greater than 30 minutes  Date of Admission:  03/07/2015  Date of Discharge: 02-08-15  Reason for Admission: Worsening symptoms of depression  Principal Problem: Depression Discharge Diagnoses: Patient Active Problem List   Diagnosis Date Noted  . Polysubstance abuse [F19.10] 12/21/2014    Priority: High  . Substance induced mood disorder (HCC) [F19.94] 12/21/2014    Priority: High  . PTSD (post-traumatic stress disorder) [F43.10] 12/21/2014    Priority: High  . MDD (major depressive disorder), recurrent severe, without psychosis (HCC) [F33.2] 12/21/2014    Priority: High  . Depression [F32.9] 03/07/2015  . Sepsis (HCC) [A41.9] 03/02/2015  . Thrombocytopenia (HCC) [D69.6] 03/02/2015  . Abscess [L02.91]   . Depression with suicidal ideation [F32.9, R45.851] 02/06/2015   Musculoskeletal: Strength & Muscle Tone: within normal limits Gait & Station: normal Patient leans: N/A  Psychiatric Specialty Exam: Physical Exam  Psychiatric: His speech is normal and behavior is normal. Judgment and thought content normal. His mood appears not anxious. His affect is not angry, not blunt, not labile and not inappropriate. Cognition and memory are normal. He does not exhibit a depressed mood.    Review of Systems  Constitutional: Negative.   HENT: Negative.   Eyes: Negative.   Respiratory: Negative.   Cardiovascular: Negative.   Gastrointestinal: Negative.   Genitourinary: Negative.   Musculoskeletal: Negative.   Skin: Negative.   Neurological: Negative.   Endo/Heme/Allergies: Negative.   Psychiatric/Behavioral: Positive for depression (Stable) and substance abuse (Hx polysubstance dependence). Negative for suicidal ideas, hallucinations and  memory loss. The patient has insomnia (Stable). The patient is not nervous/anxious.   All other systems reviewed and are negative.   Blood pressure 128/80, pulse 94, temperature 97.5 F (36.4 C), temperature source Oral, resp. rate 20, height 5\' 8"  (1.727 m), weight 78.472 kg (173 lb), SpO2 98 %.Body mass index is 26.31 kg/(m^2).  See Md's SRA   Have you used any form of tobacco in the last 30 days? (Cigarettes, Smokeless Tobacco, Cigars, and/or Pipes): Yes  Has this patient used any form of tobacco in the last 30 days? (Cigarettes, Smokeless Tobacco, Cigars, and/or Pipes) Yes, A prescription for an FDA-approved tobacco cessation medication was offered at discharge and the patient accepted.   Past Medical History:  Past Medical History  Diagnosis Date  . Rib fracture   . Orbital fracture (HCC)   . Depression   . Anxiety   . Kidney stone   . PTSD (post-traumatic stress disorder)   . Nephrolithiasis   . H/O suicide attempt     x 2  . Sepsis [facility](HCC)     Past Surgical History  Procedure Laterality Date  . Lithotripsy    . Lithotripsy    . Renal artery stent     Family History:  Family History  Problem Relation Age of Onset  . Diabetes Mellitus II Mother   . Diabetes Mellitus II Brother   . Depression Brother   . Diabetes Mellitus II Daughter     Social History:  History  Alcohol Use No     History  Drug Use  . 3.00 per week  . Special: Marijuana    Social History   Social History  . Marital Status: Married    Spouse Name: N/A  .  Number of Children: N/A  . Years of Education: N/A   Social History Main Topics  . Smoking status: Current Every Day Smoker -- 1.00 packs/day  . Smokeless tobacco: None  . Alcohol Use: No  . Drug Use: 3.00 per week    Special: Marijuana  . Sexual Activity: Not Asked   Other Topics Concern  . None   Social History Narrative   Risk to Self: Is patient at risk for suicide?: No Risk to Others: No Prior [facility]Therapy: Yes Prior  Outpatient Therapy: Yes  Level of Care:  OP  Hospital Course:   Brooklyn Peaster was admitted for Depression, and crisis management.  Pt was treated discharged with the medications listed below under Medication List.  Medical problems were identified and treated as needed.  Home medications were restarted as appropriate.  Improvement was monitored by observation and Ricardo Sims 's daily report of symptom reduction.  Emotional and mental status was monitored by daily self-inventory reports completed by Ricardo Sims and clinical staff.          Ricardo Sims was evaluated by the treatment team for stability and plans for continued recovery upon discharge. Ricardo Sims 's motivation was an integral factor for scheduling further treatment. Employment, transportation, bed availability, health status, family support, and any pending legal issues were also considered during hospital stay. Pt was offered further treatment options upon discharge including but not limited to Residential, Intensive Outpatient, and Outpatient treatment.  Ricardo Sims will follow up with the services as listed below under Follow Up Information.     Upon completion of this admission the patient was both mentally and medically stable for discharge denying suicidal/homicidal ideation, auditory/visual/tactile hallucinations, delusional thoughts and paranoia.    Consults:  psychiatry  Significant Diagnostic Studies:  labs: CBC with diff, CMP, UDS, toxicology tests, U/A, results reviewed, stable  Discharge Vitals:   Blood pressure 128/80, pulse 94, temperature 97.5 F (36.4 C), temperature source Oral, resp. rate 20, height  (1.727 m), weight 78.472 kg (173 lb), SpO2 98 %. Body mass index is 26.31 kg/(m^2). Lab Results:   No results found for this or any previous visit (from the past 72 hour(s)). Physical Findings: AIMS: Facial and Oral Movements Muscles of Facial Expression: Moderate Lips and Perioral Area:  Moderate Jaw: Moderate Tongue: None, normal,Extremity Movements Upper (arms, wrists, hands, fingers): Mild Lower (legs, knees, ankles, toes): Mild, Trunk Movements Neck, shoulders, hips: Mild, Overall Severity Severity of abnormal movements (highest score from questions above): None, normal Incapacitation due to abnormal movements: Mild Patient's awareness of abnormal movements (rate only patient's report): No Awareness, Dental Status Current problems with teeth and/or dentures?: No Does patient usually wear dentures?: No  CIWA:  CIWA-Ar Total: 1 COWS:  COWS Total Score: 0  See Psychiatric Specialty Exam and Suicide Risk Assessment completed by Attending Physician prior to discharge.  Discharge destination:  Home  Is patient on multiple antipsychotic therapies at discharge:  No   Has Patient had three or more failed trials of antipsychotic monotherapy by history:  No  Recommended Plan for Multiple Antipsychotic Therapies: NA    Medication List    STOP taking these medications        acetaminophen 500 MG tablet  Commonly known as:  TYLENOL     divalproex 250 MG 24 hr tablet  Commonly known as:  DEPAKOTE ER     doxycycline 50 MG capsule  Commonly known as:  VIBRAMYCIN     HYDROcodone-acetaminophen 5-325 MG tablet  Commonly known as:  NORCO/VICODIN     mirtazapine 15 MG tablet  Commonly known as:  REMERON     ondansetron 4 MG tablet  Commonly known as:  ZOFRAN     pregabalin 75 MG capsule  Commonly known as:  LYRICA     traZODone 100 MG tablet  Commonly known as:  DESYREL      TAKE these medications      Indication   amoxicillin 500 MG capsule  Commonly known as:  AMOXIL  Take 1 capsule (500 mg total) by mouth every 8 (eight) hours.   Indication:  wound absecess to left forearm     ARIPiprazole 15 MG tablet  Commonly known as:  ABILIFY  Take 1 tablet (15 mg total) by mouth daily. For mood control   Indication:  mood stabilization     citalopram 20 MG  tablet  Commonly known as:  CELEXA  Take 1 tablet (20 mg total) by mouth daily.   Indication:  Depression     clonazePAM 1 MG tablet  Commonly known as:  KLONOPIN  Take 1 tablet (1 mg total) by mouth 2 (two) times daily as needed (anxiety or insomnia).   Indication:  severe anxiety     gabapentin 300 MG capsule  Commonly known as:  NEURONTIN  Take 1 capsule (300 mg total) by mouth every morning.   Indication:  mood stabilization     gabapentin 300 MG capsule  Commonly known as:  NEURONTIN  Take 2 capsules (600 mg total) by mouth at bedtime.   Indication:  mood stabilization     hydrOXYzine 25 MG tablet  Commonly known as:  ATARAX/VISTARIL  Take 1 tablet (25 mg total) by mouth 3 (three) times daily as needed for anxiety.   Indication:  Anxiety     nicotine 21 mg/24hr patch  Commonly known as:  NICODERM CQ - dosed in mg/24 hours  Place 1 patch (21 mg total) onto the skin daily.   Indication:  Nicotine Addiction     omeprazole 40 MG capsule  Commonly known as:  PRILOSEC  Take 1 capsule (40 mg total) by mouth daily. For acid reflux   Indication:  Gastroesophageal Reflux Disease     prazosin 2 MG capsule  Commonly known as:  MINIPRESS  Take 1 capsule (2 mg total) by mouth at bedtime.   Indication:  PTSD       Follow-up Information    Follow up with [facility] On 03/22/2015.   Why:  at 11:00am for your initial psychiatric evaluation. At this appointment please ask your psychiatrist for a referral for therapy. This is the required process by [facility] in [facility] information:   [address] [place], [place] [address] Phone: [phone] Fax: [phone]     Follow-up recommendations: Activity:  As tolerated Diet: As recommended by your primary care doctor. Keep all scheduled follow-up appointments as recommended.  Comments: Take all your medications as prescribed by your mental healthcare provider. Report any adverse  effects and or reactions from your medicines to your outpatient provider promptly. Patient is instructed and cautioned to not engage in alcohol and or illegal drug use while on prescription medicines.  In the event of worsening symptoms, patient is instructed to call the crisis hotline, 911 and or go to the nearest ED for appropriate evaluation and treatment of symptoms. Follow-up with your primary care provider for your other medical issues, concerns and or health care needs.  Total Discharge Time: Greater than 30 minutes  Signed: Beau Fanny, FNP-BC 03/10/2015, 11:12 AM

## 2015-03-10 NOTE — Progress Notes (Signed)
  [facility] :  Will you be returning to the same living situation after discharge:  Yes,  Pt returning home  At discharge, do you have transportation home?: Yes,  Pt's wife to provide transportation Do you have the ability to pay for your medications: Yes,  Pt provided with prescriptions  Release of information consent forms completed and in the chart;  Patient's signature needed at discharge.  Patient to Follow up at: Follow-up Information    [facility] On 03/22/2015.   Why:  at 11:00am for your initial psychiatric evaluation. At this appointment please ask your psychiatrist for a referral for therapy. This is the required process by [facility]   Contact information:   [address], [address] [place], [place] [address] Phone: [phone] Fax: [phone]      Next level of care provider has access to [facility] Link:no  Patient denies SI/HI: Yes,  pt denies    Safety Planning and Suicide Prevention discussed: Yes,  with Pt; contact attempts made with wife  Have you used any form of tobacco in the last 30 days? (Cigarettes, Smokeless Tobacco, Cigars, and/or Pipes): Yes  Has patient been referred to the Quitline?: Yes, faxed on 03/10/15  Elaina HoopsCarter, Mirranda Monrroy M 03/10/2015, 5:02 PM

## 2015-03-10 NOTE — Plan of Care (Signed)
Problem: Alteration in mood Goal: STG-Patient is able to discuss feelings and issues (Patient is able to discuss feelings and issues leading to depression)  Outcome: Progressing Patient asked to speak with RN to calm himself after becoming agitated with roommate.

## 2015-03-10 NOTE — [facility] Suicide Risk Assessment (Signed)
[facility] Discharge Suicide Risk Assessment   Demographic Factors:  34 year old male, employed, lives with wife and children  Total Time spent with patient: 30 minutes  Musculoskeletal: Strength & Muscle Tone: within normal limits Gait & Station: normal Patient leans: N/A  Psychiatric Specialty Exam: Physical Exam  ROS  Blood pressure 128/80, pulse 94, temperature 97.5 F (36.4 C), temperature source Oral, resp. rate 20, height  (1.727 m), weight 173 lb (78.472 kg), SpO2 98 %.Body mass index is 26.31 kg/(m^2).  General Appearance: improved grooming   Eye Contact::  Good  Speech:  Normal Rate409  Volume:  Normal  Mood:  Euthymic- at this time denies depression  Affect:  Appropriate and Full Range- not irritable  At this time  Thought Process:  Linear  Orientation:  Full (Time, Place, and Person)  Thought Content:  linear   Suicidal Thoughts:  No- denies any suicidal ideations   Homicidal Thoughts:  No  Memory:  recent and remote grossly intact   Judgement:  Other:  improved  Insight:  improved   Psychomotor Activity:  Normal  Concentration:  Good  Recall:  Good  Fund of Knowledge:Good  Language: Good  Akathisia:  No  Handed:  Right  AIMS (if indicated):     Assets:  Communication Skills Desire for Improvement Resilience  Sleep:  Number of Hours: 4  Cognition: WNL  ADL's:  Intact   Have you used any form of tobacco in the last 30 days? (Cigarettes, Smokeless Tobacco, Cigars, and/or Pipes): Yes  Has this patient used any form of tobacco in the last 30 days? (Cigarettes, Smokeless Tobacco, Cigars, and/or Pipes) - we reviewed benefits of smoking cessation.  Patient offered script for Nicoderm patches upon discharge   Mental Status Per Nursing Assessment::   On Admission:     Current Mental Status by Physician: At this time patient is improved compared to admission- mood improved and today euthymic, affect more reactive and not irritable today, no SI , no HI, no  psychotic symptoms, future oriented, planning on returning to work as of early next week.  Loss Factors: Recent episode of infection/sepsis  Historical Factors: One prior psychiatric admission for depression. Suicide attempt two years ago.  History of Cannabis abuse   Risk Reduction Factors:   Responsible for children under 84 years of age, Sense of responsibility to family, Living with another person, especially a relative and Positive coping skills or problem solving skills  Continued Clinical Symptoms:  As noted, patient much improved compared to admission  Cognitive Features That Contribute To Risk:  No gross cognitive deficits noted upon discharge. Is alert , attentive, and oriented x 3   Suicide Risk:  Mild:  Suicidal ideation of limited frequency, intensity, duration, and specificity.  There are no identifiable plans, no associated intent, mild dysphoria and related symptoms, good self-control (both objective and subjective assessment), few other risk factors, and identifiable protective factors, including available and accessible social support.  Principal Problem: Depression Discharge Diagnoses:  Patient Active Problem List   Diagnosis Date Noted  . Depression [F32.9] 03/07/2015  . Sepsis (HCC) [A41.9] 03/02/2015  . Thrombocytopenia (HCC) [D69.6] 03/02/2015  . Abscess [L02.91]   . Depression with suicidal ideation [F32.9, R45.851] 02/06/2015  . Polysubstance abuse [F19.10] 12/21/2014  . Substance induced mood disorder (HCC) [F19.94] 12/21/2014  . PTSD (post-traumatic stress disorder) [F43.10] 12/21/2014  . MDD (major depressive disorder), recurrent severe, without psychosis (HCC) [F33.2] 12/21/2014    Follow-up Information    [facility] On 03/22/2015.   Why:  at 11:00am for your initial psychiatric evaluation. At this appointment please ask your psychiatrist for a referral for therapy. This is the required [facility]  [place]   Contact information:   [address], [address] [place], [place] [address] Phone: [phone] Fax: [phone]      Plan Of Care/Follow-up recommendations:  Activity:  as tolerated  Diet:  Regular Tests:  NA Other:  see below  Is patient on multiple antipsychotic therapies at discharge:  No   Has Patient had three or more failed trials of antipsychotic monotherapy by history:  No  Recommended Plan for Multiple Antipsychotic Therapies: NA  At this time patient is being discharged in good spirits. Plans to return home. Follow up as above . His outpatient psychiatrist is Dr. Yancey Flemingsastillo. Has an established PCP for medical issues as needed - Dr. Donnald GarrePfeiffer.   Jenavi Beedle 03/10/2015, 8:51 AM

## 2015-03-10 NOTE — Progress Notes (Signed)
Ricardo Sims is readied for DC as the physician completed his DC order and DC AVS. Ricardo Sims completed his daily assessment and on it he wrote he denied SI today and he rated his depression, hopelessness and anxiety "0/0/5" respectively. Ricardo Sims is given his DC AVS, it is reviewed with him and he states understands well as med samanding . HE is given prescriptions for his meds as well as med samples for a wk . ALl belongings are reurned to him and he is escorted to bldg entrance.

## 2015-03-10 NOTE — Tx Team (Signed)
  Interdisciplinary Treatment Plan Update (Adult)  Date:  03/10/2015  Time Reviewed:  1:27 PM   Progress in Treatment: Attending groups: Yes Participating in groups:  Yes Taking medication as prescribed:  Yes. Tolerating medication:  Yes. Family/Significant othe contact made:  Contact attempts made with pt's wife. SPE completed with pt.  Patient understands diagnosis:  Yes. and As evidenced by:  seeking treatment for SI, Depression, and for medication management Discussing patient identified problems/goals with staff:  Yes. Medical problems stabilized or resolved:  Yes. Denies suicidal/homicidal ideation: Yes.  Discharge Plan or Barriers: Shawnie Dapper appt scheduled for 11/16. Pt returning home  Reason for Continuation of Hospitalization: Depression Anxiety Medication Management Suicidal Ideation  Comments:  N/A  Estimated length of stay:  D/c today  Additional Comments:  Patient and CSW reviewed pt's identified goals and treatment plan. Patient verbalized understanding and agreed to treatment plan. CSW reviewed [facility] "Discharge Process and Patient Involvement" Form. Pt verbalized understanding of information provided and signed form.    Review of initial/current patient goals per problem list:  1. Goal(s): Patient will participate in aftercare plan  Met: Yes.   Target date: at discharge  As evidenced by: Patient will participate within aftercare plan AEB aftercare provider and housing plan at discharge being identified.  03/10/15: Pt will return home and follow-up with outpatient resources.   2. Goal (s): Patient will exhibit decreased depressive symptoms and suicidal ideations.  Met: Yes     Target date: at discharge  As evidenced by: Patient will utilize self rating of depression at 3 or below and demonstrate decreased signs of depression or be deemed stable for discharge by MD.  03/10/15:  Pt rates depression as 3/10 and presents with pleasant mood/calm  affect. Pt denies SI.    Attendees: Patient:   03/10/2015 1:27 PM   Family:   03/10/2015 1:27 PM   Physician:  Dr. Parke Poisson MD 03/10/2015 1:27 PM   Nursing:   Sandre Kitty RN  03/10/2015 1:27 PM   Clinical Social Worker:   03/10/2015 1:27 PM   Clinical Social Worker: Peri Maris [place] 03/10/2015 1:27 PM   Other:  Gerline Legacy Nurse Case Manager 03/10/2015 1:27 PM   Other:  Lucinda Dell; [facility]  03/10/2015 1:27 PM   Other:   03/10/2015 1:27 PM   Other:  03/10/2015 1:27 PM   Other:  03/10/2015 1:27 PM   Other:  03/10/2015 1:27 PM    03/10/2015 1:27 PM    03/10/2015 1:27 PM    03/10/2015 1:27 PM    03/10/2015 1:27 PM    Scribe for Treatment Team:    Peri Maris, [place] [employer] [phone]

## 2015-07-19 ENCOUNTER — Emergency Department (HOSPITAL_BASED_OUTPATIENT_CLINIC_OR_DEPARTMENT_OTHER)
Admission: EM | Admit: 2015-07-19 | Discharge: 2015-07-19 | Disposition: A | Discharge status: Home / Self Care | Payer: Self-pay | Attending: Emergency Medicine | Admitting: Emergency Medicine

## 2015-07-19 ENCOUNTER — Encounter (HOSPITAL_BASED_OUTPATIENT_CLINIC_OR_DEPARTMENT_OTHER): Payer: Self-pay

## 2015-07-19 DIAGNOSIS — F419 Anxiety disorder, unspecified: Secondary | ICD-10-CM | POA: Insufficient documentation

## 2015-07-19 DIAGNOSIS — F329 Major depressive disorder, single episode, unspecified: Secondary | ICD-10-CM | POA: Insufficient documentation

## 2015-07-19 DIAGNOSIS — Z79899 Other long term (current) drug therapy: Secondary | ICD-10-CM | POA: Insufficient documentation

## 2015-07-19 DIAGNOSIS — N23 Unspecified renal colic: Secondary | ICD-10-CM | POA: Insufficient documentation

## 2015-07-19 DIAGNOSIS — Z8781 Personal history of (healed) traumatic fracture: Secondary | ICD-10-CM | POA: Insufficient documentation

## 2015-07-19 DIAGNOSIS — F17210 Nicotine dependence, cigarettes, uncomplicated: Secondary | ICD-10-CM | POA: Insufficient documentation

## 2015-07-19 DIAGNOSIS — Z8619 Personal history of other infectious and parasitic diseases: Secondary | ICD-10-CM | POA: Insufficient documentation

## 2015-07-19 DIAGNOSIS — F4310 Post-traumatic stress disorder, unspecified: Secondary | ICD-10-CM | POA: Insufficient documentation

## 2015-07-19 LAB — URINALYSIS, ROUTINE W REFLEX MICROSCOPIC
BILIRUBIN URINE: NEGATIVE
Glucose, UA: NEGATIVE mg/dL
Ketones, ur: NEGATIVE mg/dL
LEUKOCYTES UA: NEGATIVE
NITRITE: NEGATIVE
PH: 6.5 (ref 5.0–8.0)
PROTEIN: NEGATIVE mg/dL
SPECIFIC GRAVITY, URINE: 1.014 (ref 1.005–1.030)

## 2015-07-19 LAB — BASIC METABOLIC PANEL
ANION GAP: 9 (ref 5–15)
BUN: 14 mg/dL (ref 6–20)
CO2: 25 mmol/L (ref 22–32)
Calcium: 8.7 mg/dL — ABNORMAL LOW (ref 8.9–10.3)
Chloride: 103 mmol/L (ref 101–111)
Creatinine, Ser: 0.92 mg/dL (ref 0.61–1.24)
GFR calc Af Amer: >60 mL/min (ref >60)
GLUCOSE: 102 mg/dL — AB (ref 65–99)
POTASSIUM: 3.9 mmol/L (ref 3.5–5.1)
Sodium: 137 mmol/L (ref 135–145)

## 2015-07-19 LAB — URINE MICROSCOPIC-ADD ON

## 2015-07-19 MED ORDER — TAMSULOSIN HCL 0.4 MG PO CAPS
0.4000 mg | ORAL_CAPSULE | Freq: Every day | ORAL | Status: DC
Start: 2015-07-19 — End: 2015-09-28

## 2015-07-19 MED ORDER — OXYCODONE-ACETAMINOPHEN 5-325 MG PO TABS: 2.0000 | ORAL_TABLET | ORAL | Status: DC | PRN

## 2015-07-19 MED ORDER — SODIUM CHLORIDE 0.9 % IV BOLUS (SEPSIS)
1000.0000 mL | Freq: Once | INTRAVENOUS | Status: AC
  Administered 2015-07-19: 1000 mL via INTRAVENOUS

## 2015-07-19 MED ORDER — IBUPROFEN 800 MG PO TABS: 800.0000 mg | ORAL_TABLET | Freq: Three times a day (TID) | ORAL | Status: AC

## 2015-07-19 MED ORDER — HYDROMORPHONE HCL 1 MG/ML IJ SOLN
1.0000 mg | INTRAMUSCULAR | Status: DC | PRN
  Administered 2015-07-19: 1 mg via INTRAVENOUS
  Filled 2015-07-19: qty 1

## 2015-07-19 MED ORDER — ONDANSETRON HCL 4 MG/2ML IJ SOLN
4.0000 mg | Freq: Once | INTRAMUSCULAR | Status: AC
  Administered 2015-07-19: 4 mg via INTRAVENOUS
  Filled 2015-07-19: qty 2

## 2015-07-19 MED ORDER — TAMSULOSIN HCL 0.4 MG PO CAPS
0.4000 mg | ORAL_CAPSULE | Freq: Once | ORAL | Status: AC
  Administered 2015-07-19: 0.4 mg via ORAL
  Filled 2015-07-19: qty 1

## 2015-07-19 MED ORDER — KETOROLAC TROMETHAMINE 30 MG/ML IJ SOLN
30.0000 mg | Freq: Once | INTRAMUSCULAR | Status: AC
  Administered 2015-07-19: 30 mg via INTRAVENOUS
  Filled 2015-07-19: qty 1

## 2015-07-19 NOTE — ED Notes (Addendum)
Pt reports 2 day history of hematuria, vomiting this evening, intermittent right flank and right lower back pain. Hx of renal stones - and this feels the same. Pt seen at Methadone clinic for pain management.

## 2015-07-19 NOTE — ED Provider Notes (Signed)
CSN: [id number]     Arrival date & time 07/19/15  1820 History  By signing my name below, I, Marisue Humble, attest that this documentation has been prepared under the direction and in the presence of Rolland Porter, MD . Electronically Signed: Marisue Humble, Scribe. 07/19/2015. 7:36 PM.   Chief Complaint  Patient presents with  . Hematuria   The history is provided by the patient. No language interpreter was used.   HPI Comments:  Ricardo Sims is a 35 y.o. male with PMHx of kidney stone who presents to the Emergency Department complaining of persistent hematuria onset yesterday evening. Pt reports associated mild lower abdominal pain yesterday afternoon, intermittent, moderate right flank pain onset  ~1100 today, chills, episodes of emesis, and urgency. He states this feels similar to past kidney stones; he notes passing his last kidney stone September 2016. Pt is seen at Methadone clinic for pain management. No alleviating factors noted or treatments attempted PTA.   Past Medical History  Diagnosis Date  . Rib fracture   . Orbital fracture (HCC)   . Depression   . Anxiety   . Kidney stone   . PTSD (post-traumatic stress disorder)   . Nephrolithiasis   . H/O suicide attempt     x 2  . Sepsis [facility])    Past Surgical History  Procedure Laterality Date  . Lithotripsy    . Lithotripsy    . Renal artery stent     Family History  Problem Relation Age of Onset  . Diabetes Mellitus II Mother   . Diabetes Mellitus II Brother   . Depression Brother   . Diabetes Mellitus II Daughter    Social History  Substance Use Topics  . Smoking status: Current Every Day Smoker -- 1.00 packs/day  . Smokeless tobacco: None  . Alcohol Use: No    Review of Systems  Constitutional: Positive for chills. Negative for fever, diaphoresis, appetite change and fatigue.  HENT: Negative for mouth sores, sore throat and trouble swallowing.   Eyes: Negative for visual disturbance.  Respiratory: Negative  for cough, chest tightness, shortness of breath and wheezing.   Cardiovascular: Negative for chest pain.  Gastrointestinal: Positive for vomiting and abdominal pain. Negative for nausea, diarrhea and abdominal distention.  Endocrine: Negative for polydipsia, polyphagia and polyuria.  Genitourinary: Positive for urgency, hematuria and flank pain. Negative for dysuria and frequency.  Musculoskeletal: Negative for gait problem.  Skin: Negative for color change, pallor and rash.  Neurological: Negative for dizziness, syncope, light-headedness and headaches.  Hematological: Does not bruise/bleed easily.  Psychiatric/Behavioral: Negative for behavioral problems and confusion.    Allergies  Review of patient's allergies indicates no known allergies.  Home Medications   Prior to Admission medications   Medication Sig Start Date End Date Taking? Authorizing Provider  METHADONE HCL PO Take by mouth.   Yes Historical Provider, MD  omeprazole (PRILOSEC) 40 MG capsule Take 1 capsule (40 mg total) by mouth daily. For acid reflux 03/10/15  Yes Beau Fanny, FNP  amoxicillin (AMOXIL) 500 MG capsule Take 1 capsule (500 mg total) by mouth every 8 (eight) hours. 03/10/15   Beau Fanny, FNP  ARIPiprazole (ABILIFY) 15 MG tablet Take 1 tablet (15 mg total) by mouth daily. For mood control 03/10/15   Beau Fanny, FNP  citalopram (CELEXA) 20 MG tablet Take 1 tablet (20 mg total) by mouth daily. 03/10/15   Beau Fanny, FNP  clonazePAM (KLONOPIN) 1 MG tablet Take 1 tablet (1 mg  total) by mouth 2 (two) times daily as needed (anxiety or insomnia). 03/10/15   Beau Fanny, FNP  gabapentin (NEURONTIN) 300 MG capsule Take 1 capsule (300 mg total) by mouth every morning. 03/10/15   Beau Fanny, FNP  gabapentin (NEURONTIN) 300 MG capsule Take 2 capsules (600 mg total) by mouth at bedtime. 03/10/15   Beau Fanny, FNP  hydrOXYzine (ATARAX/VISTARIL) 25 MG tablet Take 1 tablet (25 mg total) by mouth 3 (three)  times daily as needed for anxiety. 03/10/15   Beau Fanny, FNP  ibuprofen (ADVIL,MOTRIN) 800 MG tablet Take 1 tablet (800 mg total) by mouth 3 (three) times daily. 07/19/15   Rolland Porter, MD  nicotine (NICODERM CQ - DOSED IN MG/24 HOURS) 21 mg/24hr patch Place 1 patch (21 mg total) onto the skin daily. 03/10/15   Beau Fanny, FNP  oxyCODONE-acetaminophen (PERCOCET/ROXICET) 5-325 MG tablet Take 2 tablets by mouth every 4 (four) hours as needed. 07/19/15   Rolland Porter, MD  prazosin (MINIPRESS) 2 MG capsule Take 1 capsule (2 mg total) by mouth at bedtime. 03/10/15   Beau Fanny, FNP  tamsulosin (FLOMAX) 0.4 MG CAPS capsule Take 1 capsule (0.4 mg total) by mouth daily. 07/19/15   Rolland Porter, MD   BP 130/82 mmHg  Pulse 62  Temp(Src) 98.6 F (37 C) (Oral)  Resp 18  Ht  (1.727 m)  Wt 175 lb (79.379 kg)  BMI 26.61 kg/m2  SpO2 100% Physical Exam  Constitutional: He is oriented to person, place, and time. He appears well-developed and well-nourished. No distress.  HENT:  Head: Normocephalic and atraumatic.  Right Ear: Hearing normal.  Left Ear: Hearing normal.  Nose: Nose normal.  Mouth/Throat: Oropharynx is clear and moist and mucous membranes are normal.  Eyes: Conjunctivae and EOM are normal. Pupils are equal, round, and reactive to light.  Neck: Normal range of motion. Neck supple.  Cardiovascular: Regular rhythm, S1 normal and S2 normal.  Exam reveals no gallop and no friction rub.   No murmur heard. Pulmonary/Chest: Effort normal and breath sounds normal. No respiratory distress. He exhibits no tenderness.  Abdominal: Soft. Normal appearance and bowel sounds are normal. There is no hepatosplenomegaly. There is no tenderness. There is no rebound, no guarding, no tenderness at McBurney's point and negative Murphy's sign. No hernia.  Genitourinary:  Pt reports pain in right flank and suprapubic tenderness; not tender on exam  Musculoskeletal: Normal range of motion.  Neurological: He  is alert and oriented to person, place, and time. He has normal strength. No cranial nerve deficit or sensory deficit. Coordination normal. GCS eye subscore is 4. GCS verbal subscore is 5. GCS motor subscore is 6.  Skin: Skin is warm, dry and intact. No rash noted. No cyanosis.  Psychiatric: He has a normal mood and affect. His speech is normal and behavior is normal. Thought content normal.  Nursing note and vitals reviewed.  ED Course  Procedures  DIAGNOSTIC STUDIES:  Oxygen Saturation is 100% on RA, normal by my interpretation.    COORDINATION OF CARE:  7:12 PM Will order blood work and administer medication. Discussed treatment plan with pt at bedside and pt agreed to plan.  Labs Review Labs Reviewed  URINALYSIS, ROUTINE W REFLEX MICROSCOPIC (NOT AT [facility]) - Abnormal; Notable for the following:    APPearance CLOUDY (*)    Hgb urine dipstick LARGE (*)    All other components within normal limits  URINE MICROSCOPIC-ADD ON - Abnormal; Notable for  the following:    Squamous Epithelial / LPF 0-5 (*)    Bacteria, UA RARE (*)    All other components within normal limits  BASIC METABOLIC PANEL - Abnormal; Notable for the following:    Glucose, Bld 102 (*)    Calcium 8.7 (*)    All other components within normal limits    Imaging Review No results found. I have personally reviewed and evaluated these images and lab results as part of my medical decision-making.   EKG Interpretation None      MDM   Final diagnoses:  Ureteral colic   Patient is involved in a methadone clinic. He was immediately forthcoming with this. He has gross hematuria. His symptoms relieved with IV medications. I prescribed him Percocet. He is given IV Dilaudid here. I produced him a letter to take to his methadone clinic in the morning explaining his narcotic use.  I personally performed the services described in this documentation, which was scribed in my presence. The recorded information has been  reviewed and is accurate.   Rolland PorterMark Tonique Mendonca, MD 07/29/15 1311

## 2015-07-19 NOTE — Discharge Instructions (Signed)
Kidney Stones °Kidney stones (urolithiasis) are deposits that form inside your kidneys. The intense pain is caused by the stone moving through the urinary tract. When the stone moves, the ureter goes into spasm around the stone. The stone is usually passed in the urine.  °CAUSES  °· A disorder that makes certain neck glands produce too much parathyroid hormone (primary hyperparathyroidism). °· A buildup of uric acid crystals, similar to gout in your joints. °· Narrowing (stricture) of the ureter. °· A kidney obstruction present at birth (congenital obstruction). °· Previous surgery on the kidney or ureters. °· Numerous kidney infections. °SYMPTOMS  °· Feeling sick to your stomach (nauseous). °· Throwing up (vomiting). °· Blood in the urine (hematuria). °· Pain that usually spreads (radiates) to the groin. °· Frequency or urgency of urination. °DIAGNOSIS  °· Taking a history and physical exam. °· Blood or urine tests. °· CT scan. °· Occasionally, an examination of the inside of the urinary bladder (cystoscopy) is performed. °TREATMENT  °· Observation. °· Increasing your fluid intake. °· Extracorporeal shock wave lithotripsy--This is a noninvasive procedure that uses shock waves to break up kidney stones. °· Surgery may be needed if you have severe pain or persistent obstruction. There are various surgical procedures. Most of the procedures are performed with the use of small instruments. Only small incisions are needed to accommodate these instruments, so recovery time is minimized. °The size, location, and chemical composition are all important variables that will determine the proper choice of action for you. Talk to your health care provider to better understand your situation so that you will minimize the risk of injury to yourself and your kidney.  °HOME CARE INSTRUCTIONS  °· Drink enough water and fluids to keep your urine clear or pale yellow. This will help you to pass the stone or stone fragments. °· Strain  all urine through the provided strainer. Keep all particulate matter and stones for your health care provider to see. The stone causing the pain may be as small as a grain of salt. It is very important to use the strainer each and every time you pass your urine. The collection of your stone will allow your health care provider to analyze it and verify that a stone has actually passed. The stone analysis will often identify what you can do to reduce the incidence of recurrences. °· Only take over-the-counter or prescription medicines for pain, discomfort, or fever as directed by your health care provider. °· Keep all follow-up visits as told by your health care provider. This is important. °· Get follow-up X-rays if required. The absence of pain does not always mean that the stone has passed. It may have only stopped moving. If the urine remains completely obstructed, it can cause loss of kidney function or even complete destruction of the kidney. It is your responsibility to make sure X-rays and follow-ups are completed. Ultrasounds of the kidney can show blockages and the status of the kidney. Ultrasounds are not associated with any radiation and can be performed easily in a matter of minutes. °· Make changes to your daily diet as told by your health care provider. You may be told to: °¨ Limit the amount of salt that you eat. °¨ Eat 5 or more servings of fruits and vegetables each day. °¨ Limit the amount of meat, poultry, fish, and eggs that you eat. °· Collect a 24-hour urine sample as told by your health care provider. You may need to collect another urine sample every 6-12   months. °SEEK MEDICAL CARE IF: °· You experience pain that is progressive and unresponsive to any pain medicine you have been prescribed. °SEEK IMMEDIATE MEDICAL CARE IF:  °· Pain cannot be controlled with the prescribed medicine. °· You have a fever or shaking chills. °· The severity or intensity of pain increases over 18 hours and is not  relieved by pain medicine. °· You develop a new onset of abdominal pain. °· You feel faint or pass out. °· You are unable to urinate. °  °This information is not intended to replace advice given to you by your health care provider. Make sure you discuss any questions you have with your health care provider. °  °Document Released: 04/22/2005 Document Revised: 01/11/2015 Document Reviewed: 09/23/2012 °Elsevier Interactive Patient Education ©2016 [employer] ° °

## 2015-09-14 ENCOUNTER — Encounter (HOSPITAL_BASED_OUTPATIENT_CLINIC_OR_DEPARTMENT_OTHER): Payer: Self-pay | Admitting: *Deleted

## 2015-09-14 ENCOUNTER — Emergency Department (HOSPITAL_BASED_OUTPATIENT_CLINIC_OR_DEPARTMENT_OTHER)
Admission: EM | Admit: 2015-09-14 | Discharge: 2015-09-15 | Disposition: A | Discharge status: Home / Self Care | Payer: Self-pay | Attending: Emergency Medicine | Admitting: Emergency Medicine

## 2015-09-14 DIAGNOSIS — F329 Major depressive disorder, single episode, unspecified: Secondary | ICD-10-CM | POA: Insufficient documentation

## 2015-09-14 DIAGNOSIS — F17200 Nicotine dependence, unspecified, uncomplicated: Secondary | ICD-10-CM | POA: Insufficient documentation

## 2015-09-14 DIAGNOSIS — N23 Unspecified renal colic: Secondary | ICD-10-CM | POA: Insufficient documentation

## 2015-09-14 LAB — URINALYSIS, ROUTINE W REFLEX MICROSCOPIC
GLUCOSE, UA: NEGATIVE mg/dL
Ketones, ur: 15 mg/dL — AB
Nitrite: NEGATIVE
PH: 6 (ref 5.0–8.0)
Protein, ur: 30 mg/dL — AB
SPECIFIC GRAVITY, URINE: 1.025 (ref 1.005–1.030)

## 2015-09-14 LAB — URINE MICROSCOPIC-ADD ON: Bacteria, UA: NONE SEEN

## 2015-09-14 LAB — BASIC METABOLIC PANEL
ANION GAP: 7 (ref 5–15)
BUN: 9 mg/dL (ref 6–20)
CHLORIDE: 109 mmol/L (ref 101–111)
CO2: 23 mmol/L (ref 22–32)
Calcium: 9 mg/dL (ref 8.9–10.3)
Creatinine, Ser: 0.81 mg/dL (ref 0.61–1.24)
GFR calc Af Amer: >60 mL/min (ref >60)
GFR calc non Af Amer: >60 mL/min (ref >60)
GLUCOSE: 104 mg/dL — AB (ref 65–99)
POTASSIUM: 3.3 mmol/L — AB (ref 3.5–5.1)
Sodium: 139 mmol/L (ref 135–145)

## 2015-09-14 LAB — CBC
HEMATOCRIT: 41.4 % (ref 39.0–52.0)
HEMOGLOBIN: 14.4 g/dL (ref 13.0–17.0)
MCH: 30.4 pg (ref 26.0–34.0)
MCHC: 34.8 g/dL (ref 30.0–36.0)
MCV: 87.3 fL (ref 78.0–100.0)
Platelets: 235 10*3/uL (ref 150–400)
RBC: 4.74 MIL/uL (ref 4.22–5.81)
RDW: 13.3 % (ref 11.5–15.5)
WBC: 15.4 10*3/uL — ABNORMAL HIGH (ref 4.0–10.5)

## 2015-09-14 MED ORDER — ONDANSETRON HCL 4 MG/2ML IJ SOLN
4.0000 mg | Freq: Once | INTRAMUSCULAR | Status: AC
  Administered 2015-09-14: 4 mg via INTRAVENOUS
  Filled 2015-09-14: qty 2

## 2015-09-14 MED ORDER — HYDROMORPHONE HCL 1 MG/ML IJ SOLN
1.0000 mg | Freq: Once | INTRAMUSCULAR | Status: AC
  Administered 2015-09-14: 1 mg via INTRAVENOUS
  Filled 2015-09-14: qty 1

## 2015-09-14 MED ORDER — SODIUM CHLORIDE 0.9 % IV SOLN
INTRAVENOUS | Status: DC
  Administered 2015-09-14: via INTRAVENOUS

## 2015-09-14 NOTE — ED Notes (Signed)
MD at bedside. 

## 2015-09-14 NOTE — ED Provider Notes (Signed)
CSN: [id number]     Arrival date & time 09/14/15  2223 History  By signing my name below, I, Ricardo Sims, attest that this documentation has been prepared under the direction and in the presence of [name], MD . Electronically Signed: [name], Scribe. 09/14/2015. 11:22 PM.   Chief Complaint  Patient presents with  . Flank Pain   The history is provided by the patient. No language interpreter was used.   HPI Comments:  Virgina JockMichael Sims is a 35 y.o. male with PMHx of nephrolithiasis who presents to the Emergency Department complaining of gradual onset right flank pain beginning ~9 hours ago. He describes the pain as excruciating and like previous kidney stones. Pt reports associated nausea and vomiting since this morning. He has noticed blood in his urine. No alleviating factors noted or treatments attempted PTA. The patient's urologist is [name]. He had a CT done last year which showed multiple right renal stones.   Past Medical History  Diagnosis Date  . Rib fracture   . Orbital fracture (HCC)   . Depression   . Anxiety   . Kidney stone   . PTSD (post-traumatic stress disorder)   . Nephrolithiasis   . H/O suicide attempt     x 2  . Sepsis [facility](HCC)    Past Surgical History  Procedure Laterality Date  . Lithotripsy    . Lithotripsy    . Renal artery stent     Family History  Problem Relation Age of Onset  . Diabetes Mellitus II Mother   . Diabetes Mellitus II Brother   . Depression Brother   . Diabetes Mellitus II Daughter    Social History  Substance Use Topics  . Smoking status: Current Every Day Smoker -- 1.00 packs/day  . Smokeless tobacco: None  . Alcohol Use: No    Review of Systems  10 systems reviewed and all are negative for acute change except as noted in the HPI.   Allergies  Review of patient's allergies indicates no known allergies.  Home Medications   Prior to Admission medications   Medication Sig Start Date End Date  Taking? Authorizing Provider  ARIPiprazole (ABILIFY) 15 MG tablet Take 1 tablet (15 mg total) by mouth daily. For mood control 03/10/15   Beau [name], FNP  citalopram (CELEXA) 20 MG tablet Take 1 tablet (20 mg total) by mouth daily. 03/10/15   Beau [name], FNP  gabapentin (NEURONTIN) 300 MG capsule Take 1 capsule (300 mg total) by mouth every morning. 03/10/15   Beau [name], FNP  gabapentin (NEURONTIN) 300 MG capsule Take 2 capsules (600 mg total) by mouth at bedtime. 03/10/15   Beau [name], FNP  hydrOXYzine (ATARAX/VISTARIL) 25 MG tablet Take 1 tablet (25 mg total) by mouth 3 (three) times daily as needed for anxiety. 03/10/15   Beau [name], FNP  ibuprofen (ADVIL,MOTRIN) 800 MG tablet Take 1 tablet (800 mg total) by mouth 3 (three) times daily. 07/19/15   [name], MD  METHADONE HCL PO Take by mouth.    Historical Provider, MD  nicotine (NICODERM CQ - DOSED IN MG/24 HOURS) 21 mg/24hr patch Place 1 patch (21 mg total) onto the skin daily. 03/10/15   Beau [name], FNP  omeprazole (PRILOSEC) 40 MG capsule Take 1 capsule (40 mg total) by mouth daily. For acid reflux 03/10/15   Beau [name], FNP  prazosin (MINIPRESS) 2 MG capsule Take 1 capsule (2 mg total) by mouth at bedtime. 03/10/15  Beau Fanny, FNP  tamsulosin (FLOMAX) 0.4 MG CAPS capsule Take 1 capsule (0.4 mg total) by mouth daily. 07/19/15   Rolland Porter, MD   BP 135/79 mmHg  Pulse 70  Temp(Src) 99.2 F (37.3 C)  Resp 16  Ht  (1.727 m)  Wt 194 lb (87.998 kg)  BMI 29.50 kg/m2  SpO2 97%   Physical Exam General: Well-developed, well-nourished male in no acute distress; appearance consistent with age of record HENT: normocephalic; atraumatic Eyes: pupils equal, round and reactive to light; extraocular muscles intact Neck: supple Heart: regular rate and rhythm Lungs: clear to auscultation bilaterally Abdomen: soft; nondistended; nontender; no masses or hepatosplenomegaly; bowel sounds present GU: minimal right CVA  tenderness Extremities: No deformity; full range of motion; pulses normal Neurologic: Awake, alert and oriented; motor function intact in all extremities and symmetric; no facial droop Skin: Warm and dry Psychiatric: Normal mood and affect  ED Course  Procedures  DIAGNOSTIC STUDIES:  Oxygen Saturation is 96% on RA, normal by my interpretation.    COORDINATION OF CARE:  11:21 PM Discussed treatment plan with pt at bedside and pt agreed to plan.  MDM   Nursing notes and vitals signs, including pulse oximetry, reviewed.  Summary of this visit's results, reviewed by myself:  Labs:  Results for orders placed or performed during the hospital encounter of 09/14/15 (from the past 24 hour(s))  Urinalysis, Routine w reflex microscopic (not at [facility])     Status: Abnormal   Collection Time: 09/14/15 10:45 PM  Result Value Ref Range   Color, Urine RED (A) YELLOW   APPearance CLOUDY (A) CLEAR   Specific Gravity, Urine 1.025 1.005 - 1.030   pH 6.0 5.0 - 8.0   Glucose, UA NEGATIVE NEGATIVE mg/dL   Hgb urine dipstick LARGE (A) NEGATIVE   Bilirubin Urine SMALL (A) NEGATIVE   Ketones, ur 15 (A) NEGATIVE mg/dL   Protein, ur 30 (A) NEGATIVE mg/dL   Nitrite NEGATIVE NEGATIVE   Leukocytes, UA SMALL (A) NEGATIVE  Urine microscopic-add on     Status: Abnormal   Collection Time: 09/14/15 10:45 PM  Result Value Ref Range   Squamous Epithelial / LPF 0-5 (A) NONE SEEN   WBC, UA 0-5 0 - 5 WBC/hpf   RBC / HPF TOO NUMEROUS TO COUNT 0 - 5 RBC/hpf   Bacteria, UA NONE SEEN NONE SEEN  Basic metabolic panel     Status: Abnormal   Collection Time: 09/14/15 11:00 PM  Result Value Ref Range   Sodium 139 135 - 145 mmol/L   Potassium 3.3 (L) 3.5 - 5.1 mmol/L   Chloride 109 101 - 111 mmol/L   CO2 23 22 - 32 mmol/L   Glucose, Bld 104 (H) 65 - 99 mg/dL   BUN 9 6 - 20 mg/dL   Creatinine, Ser 1.61 0.61 - 1.24 mg/dL   Calcium 9.0 8.9 - 09.6 mg/dL   GFR calc non Af Amer >60 >60 mL/min   GFR calc Af Amer >60  >60 mL/min   Anion gap 7 5 - 15  CBC     Status: Abnormal   Collection Time: 09/14/15 11:00 PM  Result Value Ref Range   WBC 15.4 (H) 4.0 - 10.5 K/uL   RBC 4.74 4.22 - 5.81 MIL/uL   Hemoglobin 14.4 13.0 - 17.0 g/dL   HCT 04.5 40.9 - 81.1 %   MCV 87.3 78.0 - 100.0 fL   MCH 30.4 26.0 - 34.0 pg   MCHC 34.8 30.0 -  36.0 g/dL   RDW 16.1 09.6 - 04.5 %   Platelets 235 150 - 400 K/uL   12:16 AM Pain well controlled at this time.  Final diagnoses:  Ureteral colic   I personally performed the services described in this documentation, which was scribed in my presence. The recorded information has been reviewed and is accurate.     Paula Libra, MD 09/15/15 [phone]

## 2015-09-14 NOTE — ED Notes (Signed)
Pt c/o R flank pain, difficulty and pain with urinating and also states he has blood in his urine. Pt c/o nausea no vomiting.

## 2015-09-14 NOTE — ED Notes (Signed)
Py c/o right flank pain x 2 days

## 2015-09-15 MED ORDER — OXYCODONE-ACETAMINOPHEN 5-325 MG PO TABS: 1.0000 | ORAL_TABLET | Freq: Four times a day (QID) | ORAL | Status: DC | PRN

## 2015-09-15 MED ORDER — ONDANSETRON 8 MG PO TBDP: 8.0000 mg | ORAL_TABLET | Freq: Three times a day (TID) | ORAL | Status: DC | PRN

## 2015-09-15 NOTE — ED Notes (Signed)
MD at bedside. 

## 2015-09-15 NOTE — Discharge Instructions (Signed)
Kidney Stones °Kidney stones (urolithiasis) are deposits that form inside your kidneys. The intense pain is caused by the stone moving through the urinary tract. When the stone moves, the ureter goes into spasm around the stone. The stone is usually passed in the urine.  °CAUSES  °· A disorder that makes certain neck glands produce too much parathyroid hormone (primary hyperparathyroidism). °· A buildup of uric acid crystals, similar to gout in your joints. °· Narrowing (stricture) of the ureter. °· A kidney obstruction present at birth (congenital obstruction). °· Previous surgery on the kidney or ureters. °· Numerous kidney infections. °SYMPTOMS  °· Feeling sick to your stomach (nauseous). °· Throwing up (vomiting). °· Blood in the urine (hematuria). °· Pain that usually spreads (radiates) to the groin. °· Frequency or urgency of urination. °DIAGNOSIS  °· Taking a history and physical exam. °· Blood or urine tests. °· CT scan. °· Occasionally, an examination of the inside of the urinary bladder (cystoscopy) is performed. °TREATMENT  °· Observation. °· Increasing your fluid intake. °· Extracorporeal shock wave lithotripsy--This is a noninvasive procedure that uses shock waves to break up kidney stones. °· Surgery may be needed if you have severe pain or persistent obstruction. There are various surgical procedures. Most of the procedures are performed with the use of small instruments. Only small incisions are needed to accommodate these instruments, so recovery time is minimized. °The size, location, and chemical composition are all important variables that will determine the proper choice of action for you. Talk to your health care provider to better understand your situation so that you will minimize the risk of injury to yourself and your kidney.  °HOME CARE INSTRUCTIONS  °· Drink enough water and fluids to keep your urine clear or pale yellow. This will help you to pass the stone or stone fragments. °· Strain  all urine through the provided strainer. Keep all particulate matter and stones for your health care provider to see. The stone causing the pain may be as small as a grain of salt. It is very important to use the strainer each and every time you pass your urine. The collection of your stone will allow your health care provider to analyze it and verify that a stone has actually passed. The stone analysis will often identify what you can do to reduce the incidence of recurrences. °· Only take over-the-counter or prescription medicines for pain, discomfort, or fever as directed by your health care provider. °· Keep all follow-up visits as told by your health care provider. This is important. °· Get follow-up X-rays if required. The absence of pain does not always mean that the stone has passed. It may have only stopped moving. If the urine remains completely obstructed, it can cause loss of kidney function or even complete destruction of the kidney. It is your responsibility to make sure X-rays and follow-ups are completed. Ultrasounds of the kidney can show blockages and the status of the kidney. Ultrasounds are not associated with any radiation and can be performed easily in a matter of minutes. °· Make changes to your daily diet as told by your health care provider. You may be told to: °¨ Limit the amount of salt that you eat. °¨ Eat 5 or more servings of fruits and vegetables each day. °¨ Limit the amount of meat, poultry, fish, and eggs that you eat. °· Collect a 24-hour urine sample as told by your health care provider. You may need to collect another urine sample every 6-12   months. °SEEK MEDICAL CARE IF: °· You experience pain that is progressive and unresponsive to any pain medicine you have been prescribed. °SEEK IMMEDIATE MEDICAL CARE IF:  °· Pain cannot be controlled with the prescribed medicine. °· You have a fever or shaking chills. °· The severity or intensity of pain increases over 18 hours and is not  relieved by pain medicine. °· You develop a new onset of abdominal pain. °· You feel faint or pass out. °· You are unable to urinate. °  °This information is not intended to replace advice given to you by your health care provider. Make sure you discuss any questions you have with your health care provider. °  °Document Released: 04/22/2005 Document Revised: 01/11/2015 Document Reviewed: 09/23/2012 °Elsevier Interactive Patient Education ©2016 [employer] ° °

## 2015-09-25 ENCOUNTER — Emergency Department (HOSPITAL_COMMUNITY)
Admission: EM | Admit: 2015-09-25 | Discharge: 2015-09-26 | Disposition: A | Discharge status: Other Acute Inpatient Hospital | Payer: Self-pay | Attending: Emergency Medicine | Admitting: Emergency Medicine

## 2015-09-25 ENCOUNTER — Encounter (HOSPITAL_COMMUNITY): Payer: Self-pay | Admitting: Emergency Medicine

## 2015-09-25 DIAGNOSIS — Z792 Long term (current) use of antibiotics: Secondary | ICD-10-CM | POA: Insufficient documentation

## 2015-09-25 DIAGNOSIS — R45851 Suicidal ideations: Secondary | ICD-10-CM

## 2015-09-25 DIAGNOSIS — T403X2A Poisoning by methadone, intentional self-harm, initial encounter: Secondary | ICD-10-CM | POA: Insufficient documentation

## 2015-09-25 DIAGNOSIS — F329 Major depressive disorder, single episode, unspecified: Secondary | ICD-10-CM | POA: Insufficient documentation

## 2015-09-25 DIAGNOSIS — F17200 Nicotine dependence, unspecified, uncomplicated: Secondary | ICD-10-CM | POA: Insufficient documentation

## 2015-09-25 DIAGNOSIS — F4310 Post-traumatic stress disorder, unspecified: Secondary | ICD-10-CM | POA: Insufficient documentation

## 2015-09-25 DIAGNOSIS — Z791 Long term (current) use of non-steroidal anti-inflammatories (NSAID): Secondary | ICD-10-CM | POA: Insufficient documentation

## 2015-09-25 DIAGNOSIS — F1110 Opioid abuse, uncomplicated: Secondary | ICD-10-CM | POA: Insufficient documentation

## 2015-09-25 LAB — CBC
HEMATOCRIT: 44.1 % (ref 39.0–52.0)
HEMOGLOBIN: 14.7 g/dL (ref 13.0–17.0)
MCH: 30.1 pg (ref 26.0–34.0)
MCHC: 33.3 g/dL (ref 30.0–36.0)
MCV: 90.4 fL (ref 78.0–100.0)
Platelets: 240 10*3/uL (ref 150–400)
RBC: 4.88 MIL/uL (ref 4.22–5.81)
RDW: 13.5 % (ref 11.5–15.5)
WBC: 11.3 10*3/uL — ABNORMAL HIGH (ref 4.0–10.5)

## 2015-09-25 LAB — URINALYSIS, ROUTINE W REFLEX MICROSCOPIC
GLUCOSE, UA: NEGATIVE mg/dL
HGB URINE DIPSTICK: NEGATIVE
Ketones, ur: NEGATIVE mg/dL
Leukocytes, UA: NEGATIVE
Nitrite: NEGATIVE
PH: 5 (ref 5.0–8.0)
Protein, ur: NEGATIVE mg/dL
SPECIFIC GRAVITY, URINE: 1.024 (ref 1.005–1.030)

## 2015-09-25 LAB — HEPATIC FUNCTION PANEL
ALBUMIN: 4.6 g/dL (ref 3.5–5.0)
ALK PHOS: 47 U/L (ref 38–126)
ALT: 27 U/L (ref 17–63)
AST: 40 U/L (ref 15–41)
BILIRUBIN TOTAL: 0.7 mg/dL (ref 0.3–1.2)
Bilirubin, Direct: <0.1 mg/dL — ABNORMAL LOW (ref 0.1–0.5)
Total Protein: 7.7 g/dL (ref 6.5–8.1)

## 2015-09-25 LAB — BASIC METABOLIC PANEL
ANION GAP: 10 (ref 5–15)
BUN: 16 mg/dL (ref 6–20)
CALCIUM: 9.7 mg/dL (ref 8.9–10.3)
CHLORIDE: 104 mmol/L (ref 101–111)
CO2: 25 mmol/L (ref 22–32)
Creatinine, Ser: 0.79 mg/dL (ref 0.61–1.24)
GFR calc Af Amer: >60 mL/min (ref >60)
GFR calc non Af Amer: >60 mL/min (ref >60)
GLUCOSE: 137 mg/dL — AB (ref 65–99)
POTASSIUM: 4.3 mmol/L (ref 3.5–5.1)
Sodium: 139 mmol/L (ref 135–145)

## 2015-09-25 LAB — CBG MONITORING, ED: Glucose-Capillary: 103 mg/dL — ABNORMAL HIGH (ref 65–99)

## 2015-09-25 MED ORDER — DICYCLOMINE HCL 20 MG PO TABS
20.0000 mg | ORAL_TABLET | Freq: Four times a day (QID) | ORAL | Status: DC | PRN
  Administered 2015-09-25: 20 mg via ORAL
  Filled 2015-09-25: qty 1

## 2015-09-25 MED ORDER — LOPERAMIDE HCL 2 MG PO CAPS
2.0000 mg | ORAL_CAPSULE | ORAL | Status: DC | PRN
  Administered 2015-09-25: 4 mg via ORAL
  Filled 2015-09-25: qty 2

## 2015-09-25 MED ORDER — NAPROXEN 500 MG PO TABS: 500.0000 mg | ORAL_TABLET | Freq: Two times a day (BID) | ORAL | Status: DC | PRN

## 2015-09-25 MED ORDER — ONDANSETRON 4 MG PO TBDP
4.0000 mg | ORAL_TABLET | Freq: Four times a day (QID) | ORAL | Status: DC | PRN
  Administered 2015-09-25: 4 mg via ORAL
  Filled 2015-09-25: qty 1

## 2015-09-25 MED ORDER — METHOCARBAMOL 500 MG PO TABS
500.0000 mg | ORAL_TABLET | Freq: Three times a day (TID) | ORAL | Status: DC | PRN
  Filled 2015-09-25: qty 1

## 2015-09-25 MED ORDER — HYDROXYZINE HCL 25 MG PO TABS
25.0000 mg | ORAL_TABLET | Freq: Four times a day (QID) | ORAL | Status: DC | PRN
  Administered 2015-09-25: 25 mg via ORAL
  Filled 2015-09-25: qty 1

## 2015-09-25 MED ORDER — SODIUM CHLORIDE 0.9 % IV BOLUS (SEPSIS)
1000.0000 mL | Freq: Once | INTRAVENOUS | Status: AC
  Administered 2015-09-25: 1000 mL via INTRAVENOUS

## 2015-09-25 MED ORDER — NICOTINE 21 MG/24HR TD PT24
21.0000 mg | MEDICATED_PATCH | Freq: Every day | TRANSDERMAL | Status: DC
  Administered 2015-09-25: 21 mg via TRANSDERMAL
  Filled 2015-09-25: qty 1

## 2015-09-25 NOTE — ED Notes (Signed)
atempted call report, and was informed pt could not be accepted without a resulted UDS.

## 2015-09-25 NOTE — ED Notes (Signed)
MD at bedside. 

## 2015-09-25 NOTE — ED Provider Notes (Signed)
CSN: [id number]     Arrival date & time 09/25/15  1758 History   First MD Initiated Contact with Patient 09/25/15 2013     Chief Complaint  Patient presents with  . trying to taper self off methadone   . Suicidal  . Loss of Consciousness     HPI Patient presents with opiate abuse and suicidal thoughts/attempt. States he's been on methadone for years and has worked his way up to having to take home doses. States that he had his dose on Friday and then tried to spread out the other dose. States he had to go out of town for his new job and is hoping to taper himself off. States he took half a dose and then later took a full dose and use up all his medication. States he also relapsed on some heroin. States he was depressed and still has some depression. States he stepped [place] the [place] on [address] attempt to get hit by car. His police came and he told them thatt he went to go after a ball. Still cannot tell me that he would not hurt himself. Denies other substance abuse besides the heroin. He has had some diarrhea. He did have an episode wrist feeling sweaty and passed out while in the bathroom here. May have hit his head. No headache. No confusion now.  Past Medical History  Diagnosis Date  . Rib fracture   . Orbital fracture (HCC)   . Depression   . Anxiety   . Kidney stone   . PTSD (post-traumatic stress disorder)   . Nephrolithiasis   . H/O suicide attempt     x 2  . Sepsis [facility])    Past Surgical History  Procedure Laterality Date  . Lithotripsy    . Lithotripsy    . Renal artery stent     Family History  Problem Relation Age of Onset  . Diabetes Mellitus II Mother   . Diabetes Mellitus II Brother   . Depression Brother   . Diabetes Mellitus II Daughter    Social History  Substance Use Topics  . Smoking status: Current Every Day Smoker -- 1.00 packs/day  . Smokeless tobacco: None  . Alcohol Use: No    Review of Systems  Constitutional: Negative for activity change and  appetite change.  Eyes: Negative for pain.  Respiratory: Negative for chest tightness and shortness of breath.   Cardiovascular: Negative for chest pain and leg swelling.  Gastrointestinal: Positive for diarrhea. Negative for nausea, vomiting and abdominal pain.  Genitourinary: Negative for flank pain.  Musculoskeletal: Negative for back pain and neck stiffness.  Skin: Negative for rash.  Neurological: Negative for weakness, numbness and headaches.  Psychiatric/Behavioral: Negative for behavioral problems.      Allergies  Review of patient's allergies indicates no known allergies.  Home Medications   Prior to Admission medications   Medication Sig Start Date End Date Taking? Authorizing Provider  dicyclomine (BENTYL) 20 MG tablet Take 20 mg by mouth every 6 (six) hours as needed. Abdominal cramps 09/10/15  Yes Historical Provider, MD  doxepin (SINEQUAN) 100 MG capsule Take 100 mg by mouth at bedtime.   Yes Historical Provider, MD  gabapentin (NEURONTIN) 600 MG tablet Take 600-1,200 mg by mouth 2 (two) times daily. Take  in the am, and  at night   Yes Historical Provider, MD  hydrOXYzine (ATARAX/VISTARIL) 25 MG tablet Take 1 tablet (25 mg total) by mouth 3 (three) times daily as needed for anxiety. 03/10/15  Yes Beau [name], FNP  ibuprofen (ADVIL,MOTRIN) 800 MG tablet Take 1 tablet (800 mg total) by mouth 3 (three) times daily. Patient taking differently: Take 800 mg by mouth every 6 (six) hours as needed for headache or moderate pain.  07/19/15  Yes [name], MD  METHADONE HCL PO Take 150 mg by mouth daily.    Yes Historical Provider, MD  mirtazapine (REMERON) 15 MG tablet Take 15 mg by mouth at bedtime.   Yes Historical Provider, MD  nicotine (NICODERM CQ - DOSED IN MG/24 HOURS) 21 mg/24hr patch Place 1 patch (21 mg total) onto the skin daily. Patient taking differently: Place 21 mg onto the skin daily as needed (nicotine addiction).  03/10/15  Yes Beau [name], FNP   omeprazole (PRILOSEC) 40 MG capsule Take 1 capsule (40 mg total) by mouth daily. For acid reflux 03/10/15  Yes Beau [name], FNP  ondansetron (ZOFRAN ODT) 8 MG disintegrating tablet Take 1 tablet (8 mg total) by mouth every 8 (eight) hours as needed. 09/15/15  Yes John Molpus, MD  oxcarbazepine (TRILEPTAL) 600 MG tablet Take 600 mg by mouth 2 (two) times daily.   Yes Historical Provider, MD  oxyCODONE-acetaminophen (PERCOCET) 5-325 MG tablet Take 1-2 tablets by mouth every 6 (six) hours as needed (for pain). 09/15/15  Yes John Molpus, MD  prazosin (MINIPRESS) 2 MG capsule Take 1 capsule (2 mg total) by mouth at bedtime. 03/10/15  Yes Beau [name], FNP  ARIPiprazole (ABILIFY) 15 MG tablet Take 1 tablet (15 mg total) by mouth daily. For mood control Patient not taking: Reported on 09/25/2015 03/10/15   Beau [name], FNP  cephALEXin (KEFLEX) 500 MG capsule Take 500 mg by mouth 3 (three) times daily. Reported on 09/25/2015 09/16/15 09/26/15  Historical Provider, MD  citalopram (CELEXA) 20 MG tablet Take 1 tablet (20 mg total) by mouth daily. Patient not taking: Reported on 09/25/2015 03/10/15   Beau [name], FNP  gabapentin (NEURONTIN) 300 MG capsule Take 1 capsule (300 mg total) by mouth every morning. Patient not taking: Reported on 09/25/2015 03/10/15   Beau [name], FNP  gabapentin (NEURONTIN) 300 MG capsule Take 2 capsules (600 mg total) by mouth at bedtime. Patient not taking: Reported on 09/25/2015 03/10/15   Beau [name], FNP  tamsulosin (FLOMAX) 0.4 MG CAPS capsule Take 1 capsule (0.4 mg total) by mouth daily. Patient not taking: Reported on 09/25/2015 07/19/15   [name], MD   BP 150/132 mmHg  Pulse 87  Temp(Src) 98.9 F (37.2 C)  Resp 23  SpO2 97% Physical Exam  Constitutional: He appears well-developed.  HENT:  Head: Atraumatic.  Eyes: EOM are normal.  Cardiovascular: Normal rate.   Abdominal: Soft.  Neurological: He is alert.  Skin: Skin is warm.  Psychiatric: He has a normal  mood and affect.    ED Course  Procedures (including critical care time) Labs Review Labs Reviewed  BASIC METABOLIC PANEL - Abnormal; Notable for the following:    Glucose, Bld 137 (*)    All other components within normal limits  CBC - Abnormal; Notable for the following:    WBC 11.3 (*)    All other components within normal limits  URINALYSIS, ROUTINE W REFLEX MICROSCOPIC (NOT AT [facility]) - Abnormal; Notable for the following:    Bilirubin Urine MODERATE (*)    All other components within normal limits  HEPATIC FUNCTION PANEL - Abnormal; Notable for the following:    Bilirubin, Direct <0.1 (*)  All other components within normal limits  CBG MONITORING, ED - Abnormal; Notable for the following:    Glucose-Capillary 103 (*)    All other components within normal limits  URINE RAPID DRUG SCREEN, HOSP PERFORMED    Imaging Review No results found. I have personally reviewed and evaluated these images and lab results as part of my medical decision-making.   EKG Interpretation   Date/Time:  Monday Sep 25 2015 19:05:19 EDT Ventricular Rate:  95 PR Interval:  127 QRS Duration: 87 QT Interval:  362 QTC Calculation: 455 R Axis:   70 Text Interpretation:  Sinus rhythm Ventricular premature complex Aberrant  complex Confirmed by Rubin Payor  MD, Harrold Donath [phone]) on 09/25/2015 8:31:04 PM      MDM   Final diagnoses:  Opioid abuse  Depression with suicidal ideation    Patient with substance abuse depression with suicide attempt. Appears to medically clear at this time. This point is voluntary but I would be somewhat hasn't to let him leave since he cannot tell me it is not actively suicidal at this time.    Benjiman Core, MD 09/25/15 2128

## 2015-09-25 NOTE — ED Notes (Signed)
Patient's wife's information:  Lurena JoinerRebecca [phone]  Patient verbalizes permission to release information to wife.

## 2015-09-25 NOTE — BH Assessment (Addendum)
Tele Assessment Note   Ricardo Sims is an 35 y.o. male presenting to [facility] reporting suicidal ideations with a plan to get steal his friend's gun to end his life. Pt stated "I was thinking about going to my friend Ricardo Sims and stealing his gun". Pt reported that he also attempted suicide on Saturday but lied when the officers arrived on the scene. Pt shared that he ran out in front of a truck and it ended up swerving to avoid him Pt also reported that he has attempted suicide multiple times in the past.  Pt did not report any self-injurious behaviors. PT shared that his oldest daughter mother committed suicide. PT denies HI and AVH at this time. PT reported that he was receiving methadone maintenance. Pt shared that he had decided to wean himself off of methadone due to accepting a new position that will require him to travel out of state. Pt shared that he relapsed on heroin and marijuana. PT reported that he has been hospitalized multiple times in the past. Pt did not report any physical, sexual or emotional abuse.  Pt meets inpatient criteria.   Diagnosis: Major Depressive Disorder, Recurrent episode, Severe without psychotic features; Opioid Use Disorder, Cannabis Use Disorder.   Past Medical History:  Past Medical History  Diagnosis Date  . Rib fracture   . Orbital fracture (HCC)   . Depression   . Anxiety   . Kidney stone   . PTSD (post-traumatic stress disorder)   . Nephrolithiasis   . H/O suicide attempt     x 2  . Sepsis [facility](HCC)     Past Surgical History  Procedure Laterality Date  . Lithotripsy    . Lithotripsy    . Renal artery stent      Family History:  Family History  Problem Relation Age of Onset  . Diabetes Mellitus II Mother   . Diabetes Mellitus II Brother   . Depression Brother   . Diabetes Mellitus II Daughter     Social History:  reports that he has been smoking.  He does not have any smokeless tobacco history on file. He reports that he uses illicit drugs  (Marijuana) about 3 times per week. He reports that he does not drink alcohol.  Additional Social History:  Alcohol / Drug Use History of alcohol / drug use?: Yes Longest period of sobriety (when/how long): 8 years Negative Consequences of Use: Work / Programmer, multimediachool, Copywriter, advertisingersonal relationships, Armed forces operational officerLegal Substance #1 Name of Substance 1: Heroin  1 - Age of First Use: 31 1 - Amount (size/oz): $40 1 - Frequency: daily  1 - Duration: 2 days  1 - Last Use / Amount: 09-25-15 Substance #2 Name of Substance 2: THC  2 - Age of First Use: 13 2 - Amount (size/oz): varies  2 - Frequency: daily  2 - Duration: 1 day  2 - Last Use / Amount: 09-25-15  CIWA: CIWA-Ar BP: (!) 150/132 mmHg Pulse Rate: 87 COWS:    PATIENT STRENGTHS: (choose at least two) Average or above average intelligence Capable of independent living Motivation for treatment/growth  Allergies: No Known Allergies  Home Medications:  (Not in a hospital admission)  OB/GYN Status:  No LMP for male patient.  General Assessment Data [facility] of Assessment: [facility] TTS Assessment: In system Is this a Tele or Face-to-Face Assessment?: Face-to-Face Is this an Initial Assessment or a Re-assessment for this encounter?: Initial Assessment Marital status: Married Living Arrangements: Spouse/significant other, Children Can pt return to current living  arrangement?: Yes Admission Status: Voluntary Is patient capable of signing voluntary admission?: Yes Referral Source: Self/Family/Friend Insurance type: None      Crisis Care Plan Living Arrangements: Spouse/significant other, Children Name of Psychiatrist: [facility] and safety inspector  Name of Therapist: [facility] and safety inspector   Education Status Is patient currently in school?: No  Risk to self with the past 6 months Suicidal Ideation: Yes-Currently Present Has patient been a risk to self within the past 6 months prior to admission? : Yes Suicidal Intent: Yes-Currently Present Has patient had  any suicidal intent within the past 6 months prior to admission? : Yes Is patient at risk for suicide?: Yes Suicidal Plan?: Yes-Currently Present Has patient had any suicidal plan within the past 6 months prior to admission? : Yes Specify Current Suicidal Plan: stealing a gun from his friend to end his life. Access to Means: Yes Specify Access to Suicidal Means: could have access to friend's gun  What has been your use of drugs/alcohol within the last 12 months?: Heroin and THC use reported.  Previous Attempts/Gestures: Yes How many times?: 2 Other Self Harm Risks: Pt denies  Triggers for Past Attempts: Unpredictable Intentional Self Injurious Behavior: None Family Suicide History: Yes (oldest daugher mother ) Recent stressful life event(s): Other (Comment) (relapse ) Persecutory voices/beliefs?: No Depression: Yes Depression Symptoms: Despondent, Tearfulness, Isolating, Guilt, Loss of interest in usual pleasures, Feeling worthless/self pity, Feeling angry/irritable Substance abuse history and/or treatment for substance abuse?: Yes Suicide prevention information given to non-admitted patients: Not applicable  Risk to Others within the past 6 months Homicidal Ideation: No Does patient have any lifetime risk of violence toward others beyond the six months prior to admission? : No Thoughts of Harm to Others: No Current Homicidal Intent: No Current Homicidal Plan: No Access to Homicidal Means: No Identified Victim: N/A History of harm to others?: No Assessment of Violence: None Noted Violent Behavior Description: No violent behaviors observed.  Does patient have access to weapons?: Yes (Comment) (Friend ) Criminal Charges Pending?: No Does patient have a court date: No Is patient on probation?: No  Psychosis Hallucinations: None noted Delusions: None noted  Mental Status Report Appearance/Hygiene: In scrubs Eye Contact: Good Motor Activity: Freedom of movement Speech:  Logical/coherent Level of Consciousness: Alert Mood: Depressed Affect: Appropriate to circumstance Anxiety Level: Moderate Thought Processes: Coherent, Relevant Judgement: Partial Orientation: Person, Place, Situation, Time Obsessive Compulsive Thoughts/Behaviors: None  Cognitive Functioning Concentration: Decreased Memory: Recent Intact, Remote Intact IQ: Average Insight: Fair Impulse Control: Fair Appetite: Poor Sleep: Decreased Total Hours of Sleep: 2 Vegetative Symptoms: Staying in bed, Not bathing, Decreased grooming  ADLScreening [facility] Assessment Services) Patient's cognitive ability adequate to safely complete daily activities?: Yes Patient able to express need for assistance with ADLs?: Yes Independently performs ADLs?: Yes (appropriate for developmental age)  Prior Inpatient Therapy Prior Inpatient Therapy: Yes Prior Therapy Dates: 2014, 2016 Prior Therapy Facilty/Provider([facility] Reason for Treatment: Depression and substance abuse   Prior Outpatient Therapy Prior Outpatient Therapy: Yes Prior Therapy Dates: Current  Prior Therapy Facilty/Provider(s): [facility] Reason for Treatment: Methadone Maintenance  Does patient have an ACCT team?: No Does patient have Intensive In-Sims Services?  : No Does patient have [facility] services? : No Does patient have P4CC services?: No  ADL Screening (condition at time of admission) Patient's cognitive ability adequate to safely complete daily activities?: Yes Is the patient deaf or have difficulty hearing?: No Does the patient have difficulty seeing, even when wearing glasses/contacts?:  No Does the patient have difficulty concentrating, remembering, or making decisions?: No Patient able to express need for assistance with ADLs?: Yes Does the patient have difficulty dressing or bathing?: No Independently performs ADLs?: Yes (appropriate for developmental age)       Abuse/Neglect  Assessment (Assessment to be complete while patient is alone) Physical Abuse: Denies Verbal Abuse: Denies Sexual Abuse: Denies Exploitation of patient/patient's resources: Denies Self-Neglect: Denies     Merchant navy officer (For Healthcare) Does patient have an advance directive?: No Would patient like information on creating an advanced directive?: No - patient declined information    Additional Information 1:1 In Past 12 Months?: No CIRT Risk: No Elopement Risk: No Does patient have medical clearance?: Yes     Disposition:  Disposition Initial Assessment Completed for this Encounter: Yes Disposition of Patient: Inpatient treatment program Type of inpatient treatment program: Adult  Ricardo Sims S 09/25/2015 10:15 PM

## 2015-09-25 NOTE — ED Notes (Signed)
Report received. Patient alert and oriented, warm and dry, in no acute distress. Patient denies SI, HI, AVH, anxiety, depression and pain at this time. Patient made aware of Q15 minute rounds and security cameras for their safety. Patient instructed to come to desk with needs or concerns. Patient contracts for safety. Pt is reporting that he has started feeling poorly about 30 min ago and would like something help.

## 2015-09-25 NOTE — ED Notes (Signed)
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  Patient states that last methadone was Saturday. Patient relapsed last week on heroin.  Patient smoked marijuana earlier this morning.  Patient was in lobby restroom and remembers drying off his hands and next thing he knows he woke up on bathroom floor.  Patient head pain on right forehead.  Patient states that he has been having thoughts of SI and the toher night actually ran out into traffic but cars stopped.  Police was called out to scene but patient states that he lied to police that he was chasing after a ball.  Patient states that he spoke with his counselor today and was instructed to come here.

## 2015-09-26 ENCOUNTER — Encounter (HOSPITAL_COMMUNITY): Payer: Self-pay | Admitting: *Deleted

## 2015-09-26 ENCOUNTER — Inpatient Hospital Stay (HOSPITAL_COMMUNITY)
Admission: AD | Admit: 2015-09-26 | Discharge: 2015-09-28 | DRG: 885 | Disposition: A | Discharge status: Home / Self Care | Payer: No Typology Code available for payment source | Source: Intra-hospital | Attending: Psychiatry | Admitting: Psychiatry

## 2015-09-26 DIAGNOSIS — F1410 Cocaine abuse, uncomplicated: Secondary | ICD-10-CM | POA: Diagnosis present

## 2015-09-26 DIAGNOSIS — R45851 Suicidal ideations: Secondary | ICD-10-CM | POA: Diagnosis present

## 2015-09-26 DIAGNOSIS — F329 Major depressive disorder, single episode, unspecified: Secondary | ICD-10-CM | POA: Diagnosis present

## 2015-09-26 DIAGNOSIS — F17210 Nicotine dependence, cigarettes, uncomplicated: Secondary | ICD-10-CM | POA: Diagnosis present

## 2015-09-26 DIAGNOSIS — F332 Major depressive disorder, recurrent severe without psychotic features: Principal | ICD-10-CM | POA: Diagnosis present

## 2015-09-26 DIAGNOSIS — F1120 Opioid dependence, uncomplicated: Secondary | ICD-10-CM | POA: Diagnosis present

## 2015-09-26 DIAGNOSIS — F1994 Other psychoactive substance use, unspecified with psychoactive substance-induced mood disorder: Secondary | ICD-10-CM | POA: Diagnosis not present

## 2015-09-26 DIAGNOSIS — Z915 Personal history of self-harm: Secondary | ICD-10-CM | POA: Diagnosis not present

## 2015-09-26 LAB — RAPID URINE DRUG SCREEN, HOSP PERFORMED
AMPHETAMINES: NOT DETECTED
BARBITURATES: NOT DETECTED
BENZODIAZEPINES: NOT DETECTED
COCAINE: POSITIVE — AB
Opiates: POSITIVE — AB
TETRAHYDROCANNABINOL: NOT DETECTED

## 2015-09-26 MED ORDER — NAPROXEN 500 MG PO TABS: 500.0000 mg | ORAL_TABLET | Freq: Two times a day (BID) | ORAL | Status: DC | PRN

## 2015-09-26 MED ORDER — PANTOPRAZOLE SODIUM 40 MG PO TBEC
40.0000 mg | DELAYED_RELEASE_TABLET | Freq: Every day | ORAL | Status: DC
  Administered 2015-09-26 – 2015-09-28 (×3): 40 mg via ORAL
  Filled 2015-09-26 (×6): qty 1

## 2015-09-26 MED ORDER — GABAPENTIN 600 MG PO TABS
600.0000 mg | ORAL_TABLET | Freq: Every day | ORAL | Status: DC
  Administered 2015-09-26 – 2015-09-27 (×2): 600 mg via ORAL
  Filled 2015-09-26 (×5): qty 1

## 2015-09-26 MED ORDER — GABAPENTIN 600 MG PO TABS
1200.0000 mg | ORAL_TABLET | Freq: Every day | ORAL | Status: DC
  Administered 2015-09-26 – 2015-09-27 (×2): 1200 mg via ORAL
  Filled 2015-09-26 (×4): qty 2
  Filled 2015-09-26: qty 14

## 2015-09-26 MED ORDER — TRAZODONE HCL 50 MG PO TABS: 50.0000 mg | ORAL_TABLET | Freq: Every evening | ORAL | Status: DC | PRN

## 2015-09-26 MED ORDER — LOPERAMIDE HCL 2 MG PO CAPS
2.0000 mg | ORAL_CAPSULE | ORAL | Status: DC | PRN
  Administered 2015-09-27: 4 mg via ORAL
  Filled 2015-09-26: qty 2

## 2015-09-26 MED ORDER — MAGNESIUM HYDROXIDE 400 MG/5ML PO SUSP: 30.0000 mL | Freq: Every day | ORAL | Status: DC | PRN

## 2015-09-26 MED ORDER — MIRTAZAPINE 15 MG PO TABS
15.0000 mg | ORAL_TABLET | Freq: Every day | ORAL | Status: DC
  Filled 2015-09-26 (×2): qty 1

## 2015-09-26 MED ORDER — CLONIDINE HCL 0.1 MG PO TABS
0.1000 mg | ORAL_TABLET | Freq: Four times a day (QID) | ORAL | Status: AC
  Administered 2015-09-26 – 2015-09-27 (×8): 0.1 mg via ORAL
  Filled 2015-09-26 (×9): qty 1

## 2015-09-26 MED ORDER — CLONIDINE HCL 0.1 MG PO TABS
0.1000 mg | ORAL_TABLET | ORAL | Status: DC
  Administered 2015-09-28: 0.1 mg via ORAL
  Filled 2015-09-26 (×4): qty 1

## 2015-09-26 MED ORDER — ACETAMINOPHEN 325 MG PO TABS: 650.0000 mg | ORAL_TABLET | Freq: Four times a day (QID) | ORAL | Status: DC | PRN

## 2015-09-26 MED ORDER — HYDROXYZINE HCL 25 MG PO TABS
25.0000 mg | ORAL_TABLET | Freq: Four times a day (QID) | ORAL | Status: DC | PRN
  Administered 2015-09-26 – 2015-09-27 (×2): 25 mg via ORAL
  Filled 2015-09-26 (×2): qty 1

## 2015-09-26 MED ORDER — CLONAZEPAM 0.5 MG PO TABS
0.5000 mg | ORAL_TABLET | Freq: Three times a day (TID) | ORAL | Status: DC | PRN
  Administered 2015-09-26 – 2015-09-27 (×3): 0.5 mg via ORAL
  Filled 2015-09-26 (×3): qty 1

## 2015-09-26 MED ORDER — DICYCLOMINE HCL 20 MG PO TABS
20.0000 mg | ORAL_TABLET | Freq: Four times a day (QID) | ORAL | Status: DC | PRN
  Administered 2015-09-27: 20 mg via ORAL
  Filled 2015-09-26: qty 1

## 2015-09-26 MED ORDER — ALUM & MAG HYDROXIDE-SIMETH 200-200-20 MG/5ML PO SUSP: 30.0000 mL | ORAL | Status: DC | PRN

## 2015-09-26 MED ORDER — CLONIDINE HCL 0.1 MG PO TABS
0.1000 mg | ORAL_TABLET | Freq: Every day | ORAL | Status: DC
  Filled 2015-09-26: qty 1

## 2015-09-26 MED ORDER — NICOTINE 21 MG/24HR TD PT24
21.0000 mg | MEDICATED_PATCH | Freq: Every day | TRANSDERMAL | Status: DC
  Administered 2015-09-26 – 2015-09-27 (×2): 21 mg via TRANSDERMAL
  Filled 2015-09-26 (×5): qty 1

## 2015-09-26 MED ORDER — DOXEPIN HCL 100 MG PO CAPS
100.0000 mg | ORAL_CAPSULE | Freq: Every day | ORAL | Status: DC
  Administered 2015-09-26 – 2015-09-27 (×2): 100 mg via ORAL
  Filled 2015-09-26 (×5): qty 1

## 2015-09-26 MED ORDER — METHOCARBAMOL 500 MG PO TABS: 500.0000 mg | ORAL_TABLET | Freq: Three times a day (TID) | ORAL | Status: DC | PRN

## 2015-09-26 MED ORDER — OXCARBAZEPINE 300 MG PO TABS
600.0000 mg | ORAL_TABLET | Freq: Two times a day (BID) | ORAL | Status: DC
Start: 2015-09-26 — End: 2015-09-29
  Administered 2015-09-26 – 2015-09-28 (×5): 600 mg via ORAL
  Filled 2015-09-26: qty 2
  Filled 2015-09-26: qty 28
  Filled 2015-09-26 (×8): qty 2
  Filled 2015-09-26: qty 28
  Filled 2015-09-26: qty 2

## 2015-09-26 MED ORDER — ONDANSETRON 4 MG PO TBDP: 4.0000 mg | ORAL_TABLET | Freq: Four times a day (QID) | ORAL | Status: DC | PRN

## 2015-09-26 MED ORDER — PRAZOSIN HCL 2 MG PO CAPS
2.0000 mg | ORAL_CAPSULE | Freq: Every day | ORAL | Status: DC
  Administered 2015-09-26 – 2015-09-27 (×2): 2 mg via ORAL
  Filled 2015-09-26: qty 7
  Filled 2015-09-26 (×4): qty 1

## 2015-09-26 MED ORDER — IBUPROFEN 800 MG PO TABS
800.0000 mg | ORAL_TABLET | Freq: Three times a day (TID) | ORAL | Status: DC
  Administered 2015-09-26 – 2015-09-28 (×6): 800 mg via ORAL
  Filled 2015-09-26 (×14): qty 1

## 2015-09-26 NOTE — [facility] Group Notes (Signed)
[facility] Group Notes:  (Nursing/MHT/Case Management/Adjunct)  Date:  09/26/2015  Time:  1:40 PM Type of Therapy:  Psychoeducational Skills  Participation Level:  Did Not Attend  Participation Quality:  DID NOT ATTEND  Affect:  DID NOT ATTEND  Cognitive:  DID NOT ATTEND  Insight:  None  Engagement in Group:  DID NOT ATTEND  Modes of Intervention:  DID NOT ATTEND  Summary of Progress/Problems: Pt did not attend patient self inventory group.  Bethann PunchesJane O Melodie Ashworth 09/26/2015, 1:40 PM
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
## 2015-09-26 NOTE — Tx Team (Signed)
Initial Interdisciplinary Treatment Plan   PATIENT STRESSORS: Financial difficulties Loss of Mother in april 2010 and father suicide 1991, mother of child 2006 suicide Substance abuse   PATIENT STRENGTHS: Ability for insight Active sense of humor Average or above average intelligence Capable of independent living Wellsite geologistCommunication skills General fund of knowledge Motivation for treatment/growth Physical Health Religious Affiliation Special hobby/interest Supportive family/friends Work skills   PROBLEM LIST: Problem List/Patient Goals Date to be addressed Date deferred Reason deferred Estimated date of resolution  "To gain some better coping skill due to my depression" 09/26/15           "Also seek professional help due to withdrawals or maintenance in the future" 09/26/15           Depression 09/26/15     Increased risk for suicide 09/26/15     Substance abuse 09/26/15                  DISCHARGE CRITERIA:  Ability to meet basic life and health needs Adequate post-discharge living arrangements Improved stabilization in mood, thinking, and/or behavior Medical problems require only outpatient monitoring Motivation to continue treatment in a less acute level of care Need for constant or close observation no longer present Reduction of life-threatening or endangering symptoms to within safe limits Safe-care adequate arrangements made Verbal commitment to aftercare and medication compliance Withdrawal symptoms are absent or subacute and managed without 24-hour nursing intervention  PRELIMINARY DISCHARGE PLAN: Attend 12-step recovery group Outpatient therapy Participate in family therapy Return to previous living arrangement  PATIENT/FAMIILY INVOLVEMENT: This treatment plan has been presented to and reviewed with the patient, Ricardo Sims, and/or family member.  The patient and family have been given the opportunity to ask questions and make suggestions.  Ricardo Sims, Ricardo Sims  [name] 09/26/2015, 1:30 AM

## 2015-09-26 NOTE — Progress Notes (Signed)
   Pt was pleasant and cooperative during the adm process. Informed the writer that he's been here in the past. Per pt he attempted to wean himself from [employer] Hughes Incorporatedmethadone,citing a job as the reason. States he hasn't been doing any drugs since Oct 2016 til May 2017. Pt stated that he wasn't able to taper his meds and began using heroin and cocaine. Pt denied SI, HI, and A/V.

## 2015-09-26 NOTE — Progress Notes (Signed)
DAR NOTE: Pt present with flat affect and depressed mood in the unit. Pt did not attend group, stayed in bed Pt denies physical pain, took all his meds as scheduled. As per self inventory, pt had a poor night sleep, poor appetite, low energy, and poor concentration. Pt rate depression at 8, hopeless ness at 7, and anxiety at 8. Pt's safety ensured with 15 minute and environmental checks. Pt currently denies SI/HI and A/V hallucinations. Pt verbally agrees to seek staff if SI/HI or A/VH occurs and to consult with staff before acting on these thoughts. Will continue POC.
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
## 2015-09-26 NOTE — Progress Notes (Signed)
Recreation Therapy Notes  Animal-Assisted Activity (AAA) Program Checklist/Progress Notes Patient Eligibility Criteria Checklist & Daily Group note for Rec Tx Intervention  Date: 05.23.2017 Time: 2:45pm [address]    AAA/T Program Assumption of Risk Form signed by Patient/ or Parent Legal Guardian Yes  Patient is free of allergies or sever asthma Yes  Patient reports no fear of animals Yes  Patient reports no history of cruelty to animals Yes  Patient understands his/her participation is voluntary Yes  Patient washes hands before animal contact Yes  Patient washes hands after animal contact Yes  Behavioral Response: Engaged, Appropriate   Education: Charity fundraiserHand Washing, Appropriate Animal Interaction   Education Outcome: Acknowledges education.   Clinical Observations/Feedback: Patient interacted appropriately with therapy dog and peers during session.    Ricardo Sims, LRT/CTRS        Jahayra Mazo L 09/26/2015 3:09 PM

## 2015-09-26 NOTE — ED Notes (Signed)
[facility] , all discharge papers signed, awaiting transport from [facility].

## 2015-09-26 NOTE — Tx Team (Addendum)
Interdisciplinary Treatment Plan Update (Adult) Date: 09/26/2015    Time Reviewed: 9:30 AM  Progress in Treatment: Attending groups: Intermittently Participating in groups: Yes, when he attends Taking medication as prescribed: Yes Tolerating medication: Yes Family/Significant other contact made: No, CSW assessing for appropriate contacts Patient understands diagnosis: Yes Discussing patient identified problems/goals with staff: Yes Medical problems stabilized or resolved: Yes Denies suicidal/homicidal ideation: Yes Issues/concerns per patient self-inventory: Yes Other:  New problem(s) identified: N/A  Discharge Plan or Barriers: Home with outpatient services.  Reason for Continuation of Hospitalization:  Depression Anxiety Medication Stabilization   Comments: N/A  Estimated length of stay: 1-2 days    Patient is a 34 year old male admitted for increased depression, substance abuse, and SI with plan to shoot self or walk into traffic. Patient will benefit from crisis stabilization, medication evaluation, group therapy and psycho education in addition to case management for discharge planning. At discharge, it is recommended that Pt remain compliant with established discharge plan and continued treatment.   Review of initial/current patient goals per problem list:  1. Goal(s): Patient will participate in aftercare plan   Met: Yes   Target date: 3-5 days post admission date   As evidenced by: Patient will participate within aftercare plan AEB aftercare provider and housing plan at discharge being identified.  5/23: Goal not met: CSW assessing for appropriate referrals for pt and will have follow up secured prior to d/c.  5/25: Goal met. Patient plans to return home to follow up with outpatient services.     2. Goal (s): Patient will exhibit decreased depressive symptoms and suicidal ideations.   Met: Adequate for discharge per MD    Target date: 3-5 days post  admission date   As evidenced by: Patient will utilize self rating of depression at 3 or below and demonstrate decreased signs of depression or be deemed stable for discharge by MD.  5/23: Goal not met: Pt presents with flat affect and depressed mood.  Pt admitted with depression rating of 10.  Pt to show decreased sign of depression and a rating of 3 or less before d/c.    5/25: Adequate for discharge per MD. Patient reports feeling safe for discharge and denies SI.    4. Goal(s): Patient will demonstrate decreased signs of withdrawal due to substance abuse   Met: Adequate for discharge per MD   Target date: 3-5 days post admission date   As evidenced by: Patient will produce a CIWA/COWS score of 0, have stable vitals signs, and no symptoms of withdrawal  5/23: Goal not met: Pt continues to have withdrawal symptoms of GI upset and resting pulse rate and a COWS score of a 4.  Pt to show decrease withdrawal symptoms prior to d/c.  5/25: Adequate for discharge per MD. Patient reports feeling safe for discharge and denies SI.     Attendees: Patient:    Family:    Physician: Dr. Parke Poisson 09/26/2015 9:30 AM  Nursing: Loletta Specter, Eulogio Bear, Lysle Morales, RN 09/26/2015 9:30 AM  Clinical Social Worker: Tilden Fossa, LCSW 09/26/2015 9:30 AM  Other: Peri Maris, LCSWA; [place], LCSW  09/26/2015 9:30 AM  Other:  09/26/2015 9:30 AM  Other: Lars Pinks, Case Manager 09/26/2015 9:30 AM  Other: Larose Kells, NP 09/26/2015 9:30 AM  Other:    Other:    Other:    Other:     Scribe for Treatment Team:  Tilden Fossa, [place]

## 2015-09-27 MED ORDER — DIPHENHYDRAMINE HCL 50 MG/ML IJ SOLN
50.0000 mg | Freq: Once | INTRAMUSCULAR | Status: DC
  Filled 2015-09-27: qty 1

## 2015-09-27 MED ORDER — CLONAZEPAM 0.5 MG PO TABS
0.5000 mg | ORAL_TABLET | Freq: Two times a day (BID) | ORAL | Status: DC | PRN
  Administered 2015-09-27 – 2015-09-28 (×2): 0.5 mg via ORAL
  Filled 2015-09-27 (×2): qty 1

## 2015-09-27 MED ORDER — CLONAZEPAM 1 MG PO TABS
1.0000 mg | ORAL_TABLET | Freq: Every evening | ORAL | Status: DC | PRN
  Administered 2015-09-27: 1 mg via ORAL
  Filled 2015-09-27: qty 1

## 2015-09-27 MED ORDER — NICOTINE POLACRILEX 2 MG MT GUM
2.0000 mg | CHEWING_GUM | OROMUCOSAL | Status: DC | PRN
  Administered 2015-09-27 (×2): 2 mg via ORAL

## 2015-09-27 MED ORDER — GABAPENTIN 800 MG PO TABS
800.0000 mg | ORAL_TABLET | Freq: Every day | ORAL | Status: DC
  Administered 2015-09-28: 800 mg via ORAL
  Filled 2015-09-27: qty 1
  Filled 2015-09-27: qty 7
  Filled 2015-09-27 (×2): qty 1

## 2015-09-27 MED ORDER — ZIPRASIDONE MESYLATE 20 MG IM SOLR
10.0000 mg | Freq: Once | INTRAMUSCULAR | Status: DC
  Filled 2015-09-27: qty 20

## 2015-09-27 NOTE — [facility] Group Notes (Signed)
   [facility]  09/27/2015  8:45 AM   Participation Quality: Alert, Appropriate and Oriented  Mood/Affect: Depressed and anxious  Depression Rating: 5  Anxiety Rating: reports high anxiety level  Thoughts of Suicide: Pt denies SI/HI  Will you contract for safety? Yes  Current AVH: Pt denies  Plan for Discharge/Comments: Pt attended discharge planning group and actively participated in group. CSW provided pt with today's workbook. Patient endorses high anxiety level today related to a phone conversation with wife. He is interested in going to [facility] at discharge but reports that wife is not supportive of this plan. He continues to be interested in in IOP at [facility] and methadone maintenance. He is requesting a family session with wife.   Transportation Means: Pt reports access to transportation  Supports: No supports mentioned at this time  Samuella BruinKristin Diksha Tagliaferro, MSW, Johnson & JohnsonLCSW Clinical Social Worker [facility] [phone]

## 2015-09-27 NOTE — Progress Notes (Signed)
Recreation Therapy Notes  Date: 05.24.2017 Time: 9:30am Location: [address] Group Room   Group Topic: Stress Management  Goal Area(s) Addresses:  Patient will actively participate in stress management techniques presented during session.   Behavioral Response: Did not attend.   Boluwatife Mutchler L Aliz Meritt, LRT/CTRS        Albie Arizpe L 09/27/2015 4:06 PM 

## 2015-09-27 NOTE — [facility] Group Notes (Signed)
[facility]  Date/Time: 09/27/2015  1:30pm  Type of Therapy and Topic:  Group Therapy:  Trust and Honesty  Participation Level:  Active  Description of Group:    In this group patients will be asked to explore value of being honest.  Patients will be guided to discuss their thoughts, feelings, and behaviors related to honesty and trusting in others. Patients will process together how trust and honesty relate to how we form relationships with peers, family members, and self. Each patient will be challenged to identify and express feelings of being vulnerable. Patients will discuss reasons why people are dishonest and identify alternative outcomes if one was truthful (to self or others).  This group will be process-oriented, with patients participating in exploration of their own experiences as well as giving and receiving support and challenge from other group members.  Therapeutic Goals: 1. Patient will identify why honesty is important to relationships and how honesty overall affects relationships.  2. Patient will identify a situation where they lied or were lied too and the  feelings, thought process, and behaviors surrounding the situation 3. Patient will identify the meaning of being vulnerable, how that feels, and how that correlates to being honest with self and others. 4. Patient will identify situations where they could have told the truth, but instead lied and explain reasons of dishonesty.  Summary of Patient Progress  Patient entered group late but participated in conversation. He focused on how his wife is not honest with herself about the severity of his addiction issues and how that impacts their relationship and his recovery.    Therapeutic Modalities:   Cognitive Behavioral Therapy Solution Focused Therapy Motivational Interviewing Brief Therapy   Samuella BruinKristin Raschelle Wisenbaker, LCSW Clinical Social Worker [facility] [phone]

## 2015-09-27 NOTE — Progress Notes (Signed)
[facility] MD Progress Note  09/27/2015 2:43 PM Ricardo Sims  MRN:  [id number] Subjective:  Patient stated that he was still having nightmares and agitated at night.   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  He suffered a relapse.  He completed detox rehab program and was clean for 8 mos.  He is interested in re entering the program after dc here.   Principal Problem: Substance induced mood disorder ([place]) Diagnosis:   Patient Active Problem List   Diagnosis Date Noted  . Depression [F32.9] 03/07/2015    Priority: High  . Substance induced mood disorder ([place]) [F19.94] 12/21/2014    Priority: High  . Sepsis ([place]) [A41.9] 03/02/2015  . Thrombocytopenia ([place]) [D69.6] 03/02/2015  . Abscess [L02.91]   . Depression with suicidal ideation [F32.9, R45.851] 02/06/2015  . Polysubstance abuse [F19.10] 12/21/2014  . PTSD (post-traumatic stress disorder) [F43.10] 12/21/2014  . MDD (major depressive disorder), recurrent severe, without psychosis ([place]) [F33.2] 12/21/2014   Total Time spent with patient: 30 minutes  Past Psychiatric History: see HPI  Past Medical History:  Past Medical History  Diagnosis Date  . Rib fracture   . Orbital fracture ([place])   . Depression   . Anxiety   . Kidney stone   . PTSD (post-traumatic stress disorder)   . Nephrolithiasis   . H/O suicide attempt     x 2  . Sepsis [facility])     Past Surgical History  Procedure Laterality Date  . Lithotripsy    . Lithotripsy    . Renal artery stent     Family History:  Family History  Problem Relation Age of Onset  . Diabetes Mellitus II Mother   . Diabetes Mellitus II Brother   . Depression Brother   . Diabetes Mellitus II Daughter    Family Psychiatric  History: see HPI Social History:  History  Alcohol Use No     History  Drug Use  . 3.00 per week  . Special: Marijuana, Heroin    Social History   Social History  . Marital Status: Married    Spouse Name: N/A  . Number of Children: N/A  . Years of  Education: N/A   Social History Main Topics  . Smoking status: Current Every Day Smoker -- 1.00 packs/day for 17 years  . Smokeless tobacco: None  . Alcohol Use: No  . Drug Use: 3.00 per week    Special: Marijuana, Heroin  . Sexual Activity: Yes    Birth Control/ Protection: None   Other Topics Concern  . None   Social History Narrative   Additional Social History:    Pain Medications: see pta Prescriptions: see pta Over the Counter: see pta History of alcohol / drug use?: Yes Longest period of sobriety (when/how long): 8 yrs 1997 to 2005 Negative Consequences of Use: Work / Youth worker, Charity fundraiser relationships, Scientist, research (physical sciences), Financial Name of Substance 1: see below 1 - Age of First Use: sab 1 - Amount (size/oz): sab 1 - Frequency: sab 1 - Last Use / Amount: sab Name of Substance 2: see below 2 - Age of First Use: sab 2 - Amount (size/oz): sab 2 - Frequency: sab 2 - Duration: sab 2 - Last Use / Amount: sab                Sleep: Good  Appetite:  Good  Current Medications: Current Facility-Administered Medications  Medication Dose Route Frequency Provider Last Rate Last Dose  . acetaminophen (TYLENOL) tablet 650 mg  650 mg Oral Q6H PRN Laverle Hobby, PA-C      . alum & mag hydroxide-simeth (MAALOX/MYLANTA) 200-200-20 MG/5ML suspension 30 mL  30 mL Oral Q4H PRN Laverle Hobby, PA-C      . clonazePAM Bobbye [place]) tablet 0.5 mg  0.5 mg Oral BID PRN Kerrie Buffalo, NP      . clonazePAM [place]bye [place]) tablet 1 mg  1 mg Oral QHS PRN Kerrie Buffalo, NP      . cloNIDine (CATAPRES) tablet 0.1 mg  0.1 mg Oral QID Laverle Hobby, PA-C   0.1 mg at 09/27/15 1149   Followed by  . [START ON 09/28/2015] cloNIDine (CATAPRES) tablet 0.1 mg  0.1 mg Oral BH-qamhs Spencer E Simon, PA-C       Followed by  . [START ON 09/30/2015] cloNIDine (CATAPRES) tablet 0.1 mg  0.1 mg Oral QAC breakfast Laverle Hobby, PA-C      . dicyclomine (BENTYL) tablet 20 mg  20 mg Oral Q6H PRN Laverle Hobby, PA-C   20  mg at 09/27/15 1037  . doxepin (SINEQUAN) capsule 100 mg  100 mg Oral QHS Laverle Hobby, PA-C   100 mg at 09/26/15 2214  . gabapentin (NEURONTIN) tablet 1,200 mg  1,200 mg Oral QHS Myer Peer Cobos, MD   1,200 mg at 09/26/15 2214  . [START ON 09/28/2015] gabapentin (NEURONTIN) tablet 800 mg  800 mg Oral Daily Kerrie Buffalo, NP      . hydrOXYzine (ATARAX/VISTARIL) tablet 25 mg  25 mg Oral Q6H PRN Laverle Hobby, PA-C   25 mg at 09/27/15 4481  . ibuprofen (ADVIL,MOTRIN) tablet 800 mg  800 mg Oral TID Laverle Hobby, PA-C   800 mg at 09/27/15 0813  . loperamide (IMODIUM) capsule 2-4 mg  2-4 mg Oral PRN Laverle Hobby, PA-C   4 mg at 09/27/15 8563  . magnesium hydroxide (MILK OF MAGNESIA) suspension 30 mL  30 mL Oral Daily PRN Laverle Hobby, PA-C      . methocarbamol (ROBAXIN) tablet 500 mg  500 mg Oral Q8H PRN Laverle Hobby, PA-C      . naproxen (NAPROSYN) tablet 500 mg  500 mg Oral BID PRN Laverle Hobby, PA-C      . nicotine polacrilex (NICORETTE) gum 2 mg  2 mg Oral PRN Myer Peer Cobos, MD      . ondansetron (ZOFRAN-ODT) disintegrating tablet 4 mg  4 mg Oral Q6H PRN Laverle Hobby, PA-C      . Oxcarbazepine (TRILEPTAL) tablet 600 mg  600 mg Oral BID Laverle Hobby, PA-C   600 mg at 09/27/15 0813  . pantoprazole (PROTONIX) EC tablet 40 mg  40 mg Oral Daily Laverle Hobby, PA-C   40 mg at 09/27/15 1497  . prazosin (MINIPRESS) capsule 2 mg  2 mg Oral QHS Laverle Hobby, PA-C   2 mg at 09/26/15 2213    Lab Results:  Results for orders placed or performed during the hospital encounter of 09/25/15 (from the past 48 hour(s))  Urinalysis, Routine w reflex microscopic     Status: Abnormal   Collection Time: 09/25/15  7:20 PM  Result Value Ref Range   Color, Urine YELLOW YELLOW   APPearance CLEAR CLEAR   Specific Gravity, Urine 1.024 1.005 - 1.030   pH 5.0 5.0 - 8.0   Glucose, UA NEGATIVE NEGATIVE mg/dL   Hgb urine dipstick NEGATIVE NEGATIVE   Bilirubin Urine MODERATE (A) NEGATIVE    Ketones, ur  NEGATIVE NEGATIVE mg/dL   Protein, ur NEGATIVE NEGATIVE mg/dL   Nitrite NEGATIVE NEGATIVE   Leukocytes, UA NEGATIVE NEGATIVE    Comment: MICROSCOPIC NOT DONE ON URINES WITH NEGATIVE PROTEIN, BLOOD, LEUKOCYTES, NITRITE, OR GLUCOSE <1000 mg/dL.  Basic metabolic panel     Status: Abnormal   Collection Time: 09/25/15  7:57 PM  Result Value Ref Range   Sodium 139 135 - 145 mmol/L   Potassium 4.3 3.5 - 5.1 mmol/L   Chloride 104 101 - 111 mmol/L   CO2 25 22 - 32 mmol/L   Glucose, Bld 137 (H) 65 - 99 mg/dL   BUN 16 6 - 20 mg/dL   Creatinine, Ser 0.79 0.61 - 1.24 mg/dL   Calcium 9.7 8.9 - 10.3 mg/dL   GFR calc non Af Amer >60 >60 mL/min   GFR calc Af Amer >60 >60 mL/min    Comment: (NOTE) The eGFR has been calculated using the CKD EPI equation. This calculation has not been validated in all clinical situations. eGFR's persistently <60 mL/min signify possible Chronic Kidney Disease.    Anion gap 10 5 - 15  CBC     Status: Abnormal   Collection Time: 09/25/15  7:57 PM  Result Value Ref Range   WBC 11.3 (H) 4.0 - 10.5 K/uL   RBC 4.88 4.22 - 5.81 MIL/uL   Hemoglobin 14.7 13.0 - 17.0 g/dL   HCT 44.1 39.0 - 52.0 %   MCV 90.4 78.0 - 100.0 fL   MCH 30.1 26.0 - 34.0 pg   MCHC 33.3 30.0 - 36.0 g/dL   RDW 13.5 11.5 - 15.5 %   Platelets 240 150 - 400 K/uL  Hepatic function panel     Status: Abnormal   Collection Time: 09/25/15  7:57 PM  Result Value Ref Range   Total Protein 7.7 6.5 - 8.1 g/dL   Albumin 4.6 3.5 - 5.0 g/dL   AST 40 15 - 41 U/L   ALT 27 17 - 63 U/L   Alkaline Phosphatase 47 38 - 126 U/L   Total Bilirubin 0.7 0.3 - 1.2 mg/dL   Bilirubin, Direct <0.1 (L) 0.1 - 0.5 mg/dL   Indirect Bilirubin NOT CALCULATED 0.3 - 0.9 mg/dL  CBG monitoring, ED     Status: Abnormal   Collection Time: 09/25/15  8:53 PM  Result Value Ref Range   Glucose-Capillary 103 (H) 65 - 99 mg/dL  Urine rapid drug screen (hosp performed)     Status: Abnormal   Collection Time: 09/25/15 11:45 PM   Result Value Ref Range   Opiates POSITIVE (A) NONE DETECTED   Cocaine POSITIVE (A) NONE DETECTED   Benzodiazepines NONE DETECTED NONE DETECTED   Amphetamines NONE DETECTED NONE DETECTED   Tetrahydrocannabinol NONE DETECTED NONE DETECTED   Barbiturates NONE DETECTED NONE DETECTED    Comment:        DRUG SCREEN FOR MEDICAL PURPOSES ONLY.  IF CONFIRMATION IS NEEDED FOR ANY PURPOSE, NOTIFY LAB WITHIN 5 DAYS.        LOWEST DETECTABLE LIMITS FOR URINE DRUG SCREEN Drug Class       Cutoff (ng/mL) Amphetamine      1000 Barbiturate      200 Benzodiazepine   591 Tricyclics       638 Opiates          300 Cocaine          300 THC              50  Blood Alcohol level:  Lab Results  Component Value Date   ETH <5 03/06/2015   ETH <5 12/20/2014    Physical Findings: AIMS: Facial and Oral Movements Muscles of Facial Expression: None, normal Lips and Perioral Area: None, normal Jaw: None, normal Tongue: None, normal,Extremity Movements Upper (arms, wrists, hands, fingers): None, normal Lower (legs, knees, ankles, toes): None, normal, Trunk Movements Neck, shoulders, hips: None, normal, Overall Severity Severity of abnormal movements (highest score from questions above): None, normal Incapacitation due to abnormal movements: None, normal Patient's awareness of abnormal movements (rate only patient's report): No Awareness, Dental Status Current problems with teeth and/or dentures?: No Does patient usually wear dentures?: No  CIWA:  CIWA-Ar Total: 1 COWS:  COWS Total Score: 2  Musculoskeletal: Strength & Muscle Tone: within normal limits Gait & Station: normal Patient leans: N/A  Psychiatric Specialty Exam: Physical Exam  Vitals reviewed. Psychiatric: His mood appears anxious. His affect is labile. He is agitated. He exhibits a depressed mood.    Review of Systems  Psychiatric/Behavioral: Positive for depression and substance abuse. Negative for suicidal ideas. The patient  is nervous/anxious and has insomnia.   All other systems reviewed and are negative.   Blood pressure 108/74, pulse 98, temperature 98.9 F (37.2 C), temperature source Oral, resp. rate 17, height 5' 7"  (1.702 m), weight 84.823 kg (187 lb).Body mass index is 29.28 kg/(m^2).  General Appearance: Casual  Eye Contact:  Good  Speech:  Clear and Coherent  Volume:  Normal  Mood:  Anxious  Affect:  Appropriate and Depressed  Thought Process:  Coherent  Orientation:  NA  Thought Content:  Rumination  Suicidal Thoughts:  No  Homicidal Thoughts:  No  Memory:  Immediate;   Fair Recent;   Fair Remote;   Fair  Judgement:  Fair  Insight:  Fair  Psychomotor Activity:  Normal  Concentration:  Concentration: Fair and Attention Span: Fair  Recall:  [employer] of Knowledge:  Fair  Language:  Fair  Akathisia:  No  Handed:  Right  AIMS (if indicated):     Assets:  Communication Skills Desire for Improvement Housing Resilience Social Support Talents/Skills Vocational/Educational  ADL's:  Intact  Cognition:  WNL  Sleep:  Number of Hours: 3.5   Treatment Plan Summary: Review of chart, vital signs, medications, and notes.  1-Individual and group therapy  2-Medication management for depression and anxiety: Medications reviewed with the patient.  Trileptal 600 mg BID mood stabilization, Remeron 15 mg HS depression, Doxepin 100 mg sleep, Gabapentin Increased am dose to 800 mg daily and continued 1200 mg HS anxiety.  Cont Klonopin 0.5 mg BID PRN and increased PRN night dose to 1 mg. 3-Coping skills for depression, anxiety  4-Continue crisis stabilization and management  5-Address health issues--monitoring vital signs, stable  6-Treatment plan in progress to prevent relapse of depression and anxiety  Janett Labella, NP [facility] 09/27/2015, 2:43 PM Agree with NP progress note as above

## 2015-09-27 NOTE — Progress Notes (Signed)
Adult Psychoeducational Group Note  Date:  09/27/2015 Time:  8:52 PM  Group Topic/Focus:  NA Group  Participation Level:  Active  Participation Quality:  Appropriate  Affect:  Appropriate  Cognitive:  Appropriate  Insight: Appropriate  Engagement in Group:  Developing/Improving  Modes of Intervention:  Discussion  Additional Comments:  Patient attended group  Lyndee HensenGoins, Aaric Dolph R 09/27/2015, 8:52 PM

## 2015-09-27 NOTE — Progress Notes (Signed)
D: Patient denies SI/HI and A/V hallucinations; patient reports sleep is fair; reports appetite is good; reports energy level is normal ; reports ability to concentrate is good; rates depression as 5/10; rates hopelessness 4/10; rates anxiety as 5/10; patient complains of diarrhea, tremors, cravings, agitation, and runny nose  A: Monitored q 15 minutes; patient encouraged to attend groups; patient educated about medications; patient given medications per physician orders; patient encouraged to express feelings and/or concerns  R: Patient reports some relief of the symptoms once they were addressed; patient is cooperative and pleasant; patient's interaction with staff and peers is appropriate; patient was able to set goal to talk with staff 1:1 when having feelings of SI; patient is taking medications as prescribed and tolerating medications; patient is attending most groups

## 2015-09-27 NOTE — Progress Notes (Signed)
  D:  Writer overheard pt talking on the telephone to his wife. Pt was arguing at his wife and her sister. Writer spoke with pt and encouraged to speak softer. Pt informed the writer that he is upset because his wife is getting "soft". Stated they had boundaries in place for if he relapsed and she didn't follow them. Writer encouraged pt to speak with his dr in hopes of getting a family session.   A:  Support and encouragement was offered. 15 min checks continued for safety.  R: Pt remains safe.

## 2015-09-27 NOTE — Progress Notes (Signed)
  D: Pt was laying in bed during the assessment. Informed the writer that today was a "decent day to where he could catch up on some sleep." Stated that he hasn't been able to sleep much. Pt refused to go to AA group.  Pt has no questions or concerns.    A:  Support and encouragement was offered. 15 min checks continued for safety.  R: Pt remains safe.

## 2015-09-27 NOTE — Progress Notes (Signed)
D: Pt informed the writer that he a "decent day to where he could catch up on some sleep". Stated he hasn't been able to sleep much. Pt refused to attend AA group tonight. Pt has no questions or concerns.    A:  Support and encouragement was offered. 15 min checks continued for safety.  R: Pt remains safe.

## 2015-09-27 NOTE — Progress Notes (Signed)
Nutrition Brief Note  Patient identified on the Malnutrition Screening Tool (MST) Report  Wt Readings from Last 15 Encounters:  09/26/15 187 lb (84.823 kg)  09/14/15 194 lb (87.998 kg)  07/19/15 175 lb (79.379 kg)  03/07/15 173 lb (78.472 kg)  03/06/15 173 lb (78.472 kg)  03/04/15 178 lb (80.74 kg)  03/02/15 178 lb (80.74 kg)  02/06/15 163 lb (73.936 kg)  02/05/15 163 lb 14.4 oz (74.345 kg)  01/20/15 170 lb (77.111 kg)    Body mass index is 29.28 kg/(m^2). Patient meets criteria for overweight based on current BMI.   Current diet order is regular, patient is consuming approximately 100% of meals at this time. Labs and medications reviewed.   No nutrition interventions warranted at this time. If nutrition issues arise, please consult RD.   Dionne AnoWilliam M. Karlen Barbar, MS, RD LDN Inpatient Clinical Dietitian Pager [phone]

## 2015-09-28 DIAGNOSIS — F1120 Opioid dependence, uncomplicated: Secondary | ICD-10-CM | POA: Diagnosis present

## 2015-09-28 DIAGNOSIS — F1410 Cocaine abuse, uncomplicated: Secondary | ICD-10-CM | POA: Diagnosis present

## 2015-09-28 LAB — HEMOGLOBIN A1C
Hgb A1c MFr Bld: 5.7 % — ABNORMAL HIGH (ref 4.8–5.6)
Mean Plasma Glucose: 117 mg/dL

## 2015-09-28 MED ORDER — RISPERIDONE 1 MG PO TBDP
1.0000 mg | ORAL_TABLET | Freq: Once | ORAL | Status: AC
  Administered 2015-09-28: 1 mg via ORAL
  Filled 2015-09-28: qty 1

## 2015-09-28 MED ORDER — TAMSULOSIN HCL 0.4 MG PO CAPS: 0.4000 mg | ORAL_CAPSULE | Freq: Every day | ORAL | Status: AC

## 2015-09-28 MED ORDER — CYCLOBENZAPRINE HCL 10 MG PO TABS
10.0000 mg | ORAL_TABLET | Freq: Three times a day (TID) | ORAL | Status: DC
  Filled 2015-09-28 (×4): qty 1

## 2015-09-28 MED ORDER — HYDROXYZINE HCL 50 MG PO TABS: 50.0000 mg | ORAL_TABLET | Freq: Three times a day (TID) | ORAL | Status: AC

## 2015-09-28 MED ORDER — GABAPENTIN 800 MG PO TABS: 800.0000 mg | ORAL_TABLET | Freq: Every day | ORAL | Status: AC

## 2015-09-28 MED ORDER — PRAZOSIN HCL 2 MG PO CAPS: 2.0000 mg | ORAL_CAPSULE | Freq: Every day | ORAL | Status: AC

## 2015-09-28 MED ORDER — QUETIAPINE FUMARATE 100 MG PO TABS
100.0000 mg | ORAL_TABLET | Freq: Three times a day (TID) | ORAL | Status: DC
  Filled 2015-09-28 (×4): qty 1

## 2015-09-28 MED ORDER — LORAZEPAM 2 MG/ML IJ SOLN
INTRAMUSCULAR | Status: AC
  Administered 2015-09-28: 2 mg
  Filled 2015-09-28: qty 1

## 2015-09-28 MED ORDER — MIRTAZAPINE 15 MG PO TABS: 15.0000 mg | ORAL_TABLET | Freq: Every day | ORAL | Status: AC

## 2015-09-28 MED ORDER — DOXEPIN HCL 50 MG PO CAPS
100.0000 mg | ORAL_CAPSULE | Freq: Every day | ORAL | Status: DC
Start: 2015-09-28 — End: 2015-09-29
  Filled 2015-09-28 (×2): qty 14

## 2015-09-28 MED ORDER — RISPERIDONE 1 MG PO TBDP
ORAL_TABLET | ORAL | Status: AC
  Administered 2015-09-28: 1 mg via ORAL
  Filled 2015-09-28: qty 1

## 2015-09-28 MED ORDER — GABAPENTIN 600 MG PO TABS: 1200.0000 mg | ORAL_TABLET | Freq: Every day | ORAL | Status: AC

## 2015-09-28 MED ORDER — OXCARBAZEPINE 600 MG PO TABS: 600.0000 mg | ORAL_TABLET | Freq: Two times a day (BID) | ORAL | Status: AC

## 2015-09-28 MED ORDER — MIRTAZAPINE 15 MG PO TABS
15.0000 mg | ORAL_TABLET | Freq: Every day | ORAL | Status: DC
  Filled 2015-09-28 (×2): qty 7

## 2015-09-28 MED ORDER — HALOPERIDOL LACTATE 5 MG/ML IJ SOLN
5.0000 mg | Freq: Once | INTRAMUSCULAR | Status: AC
  Administered 2015-09-28: 5 mg via INTRAMUSCULAR
  Filled 2015-09-28: qty 1

## 2015-09-28 MED ORDER — LORAZEPAM 2 MG/ML IJ SOLN: 2.0000 mg | Freq: Once | INTRAMUSCULAR | Status: DC

## 2015-09-28 MED ORDER — HYDROXYZINE HCL 50 MG PO TABS
50.0000 mg | ORAL_TABLET | Freq: Three times a day (TID) | ORAL | Status: DC
  Filled 2015-09-28 (×2): qty 1
  Filled 2015-09-28: qty 21
  Filled 2015-09-28: qty 1
  Filled 2015-09-28 (×2): qty 21

## 2015-09-28 MED ORDER — QUETIAPINE FUMARATE 400 MG PO TABS
400.0000 mg | ORAL_TABLET | Freq: Every day | ORAL | Status: DC
  Filled 2015-09-28: qty 1

## 2015-09-28 MED ORDER — DOXEPIN HCL 100 MG PO CAPS: 100.0000 mg | ORAL_CAPSULE | Freq: Every day | ORAL | Status: AC

## 2015-09-28 MED ORDER — HALOPERIDOL LACTATE 5 MG/ML IJ SOLN
INTRAMUSCULAR | Status: AC
  Filled 2015-09-28: qty 1

## 2015-09-28 NOTE — Progress Notes (Signed)
  [facility] :  Will you be returning to the same living situation after discharge:  Yes,  patient plans to return home with wife At discharge, do you have transportation home?: Yes,  bus or wife Do you have the ability to pay for your medications: Yes,  patient will be provided with prescriptions at discharge  Release of information consent forms completed and in the chart;  Patient's signature needed at discharge.  Patient to Follow up at: Follow-up Information    Follow up with [facility].   Why:  Please go to walk-in clinic or call at discharge regarding restarting IOP program.    Contact information:   [address],  [place], [place] [address]  Phone: [phone]      Next level of care provider [facility]:no  Safety Planning and Suicide Prevention discussed: Yes,  with patient   Have you used any form of tobacco in the last 30 days? (Cigarettes, Smokeless Tobacco, Cigars, and/or Pipes): Yes  Has patient been referred to the Quitline?: Patient refused referral  Patient has been referred for addiction treatment: Yes  Katiya Fike, [place]boK[place]in L 09/28/2015, 3:03 PM

## 2015-09-28 NOTE — Discharge Instructions (Signed)
Major Depressive Disorder °Major depressive disorder is a mental illness. It also may be called clinical depression or unipolar depression. Major depressive disorder usually causes feelings of sadness, hopelessness, or helplessness. Some people with this disorder do not feel particularly sad but lose interest in doing things they used to enjoy (anhedonia). Major depressive disorder also can cause physical symptoms. It can interfere with work, school, relationships, and other normal everyday activities. The disorder varies in severity but is longer lasting and more serious than the sadness we all feel from time to time in our lives. °Major depressive disorder often is triggered by stressful life events or major life changes. Examples of these triggers include divorce, loss of your job or home, a move, and the death of a family member or close friend. Sometimes this disorder occurs for no obvious reason at all. People who have family members with major depressive disorder or bipolar disorder are at higher risk for developing this disorder, with or without life stressors. Major depressive disorder can occur at any age. It may occur just once in your life (single episode major depressive disorder). It may occur multiple times (recurrent major depressive disorder). °SYMPTOMS °People with major depressive disorder have either anhedonia or depressed mood on nearly a daily basis for at least 2 weeks or longer. Symptoms of depressed mood include: °· Feelings of sadness (blue or down in the dumps) or emptiness. °· Feelings of hopelessness or helplessness. °· Tearfulness or episodes of crying (may be observed by others). °· Irritability (children and adolescents). °In addition to depressed mood or anhedonia or both, people with this disorder have at least four of the following symptoms: °· Difficulty sleeping or sleeping too much.   °· Significant change (increase or decrease) in appetite or weight.   °· Lack of energy or  motivation. °· Feelings of guilt and worthlessness.   °· Difficulty concentrating, remembering, or making decisions. °· Unusually slow movement (psychomotor retardation) or restlessness (as observed by others).   °· Recurrent wishes for death, recurrent thoughts of self-harm (suicide), or a suicide attempt. °People with major depressive disorder commonly have persistent negative thoughts about themselves, other people, and the world. People with severe major depressive disorder may experience distorted beliefs or perceptions about the world (psychotic delusions). They also may see or hear things that are not real (psychotic hallucinations). °DIAGNOSIS °Major depressive disorder is diagnosed through an assessment by your health care provider. Your health care provider will ask about aspects of your daily life, such as mood, sleep, and appetite, to see if you have the diagnostic symptoms of major depressive disorder. Your health care provider may ask about your medical history and use of alcohol or drugs, including prescription medicines. Your health care provider also may do a physical exam and blood work. This is because certain medical conditions and the use of certain substances can cause major depressive disorder-like symptoms (secondary depression). Your health care provider also may refer you to a mental health specialist for further evaluation and treatment. °TREATMENT °It is important to recognize the symptoms of major depressive disorder and seek treatment. The following treatments can be prescribed for this disorder:   °· Medicine. Antidepressant medicines usually are prescribed. Antidepressant medicines are thought to correct chemical imbalances in the brain that are commonly associated with major depressive disorder. Other types of medicine may be added if the symptoms do not respond to antidepressant medicines alone or if psychotic delusions or hallucinations occur. °· Talk therapy. Talk therapy can be  helpful in treating major depressive disorder by providing   support, education, and guidance. Certain types of talk therapy also can help with negative thinking (cognitive behavioral therapy) and with relationship issues that trigger this disorder (interpersonal therapy). A mental health specialist can help determine which treatment is best for you. Most people with major depressive disorder do well with a combination of medicine and talk therapy. Treatments involving electrical stimulation of the brain can be used in situations with extremely severe symptoms or when medicine and talk therapy do not work over time. These treatments include electroconvulsive therapy, transcranial magnetic stimulation, and vagal nerve stimulation.   This information is not intended to replace advice given to you by your health care provider. Make sure you discuss any questions you have with your health care provider.   Document Released: 08/17/2012 Document Revised: 05/13/2014 Document Reviewed: 08/17/2012 Elsevier Interactive Patient Education 2016 [employer] Inc.  Opioid Use Disorder Opioid use disorder is a mental disorder. It is the continued nonmedical use of opioids in spite of risks to health and well-being. Misused opioids include the street drug heroin. They also include pain medicines such as morphine, hydrocodone, oxycodone, and fentanyl. Opioids are very addictive. People who misuse opioids get an exaggerated feeling of well-being. Opioid use disorder often disrupts activities at home, work, or school. It may cause mental or physical problems.  A family history of opioid use disorder puts you at higher risk of it. People with opioid use disorder often misuse other drugs or have mental illness such as depression, posttraumatic stress disorder, or antisocial personality disorder. They also are at risk of suicide and death from overdose. SIGNS AND SYMPTOMS  Signs and symptoms of opioid use disorder include:  Use  of opioids in larger amounts or over a longer period than intended.  Unsuccessful attempts to cut down or control opioid use.  A lot of time spent obtaining, using, or recovering from the effects of opioids.  A strong desire or urge to use opioids (craving).  Continued use of opioids in spite of major problems at work, school, or home because of use.  Continued use of opioids in spite of relationship problems because of use.  Giving up or cutting down on important life activities because of opioid use.  Use of opioids over and over in situations when it is physically hazardous, such as driving a car.  Continued use of opioids in spite of a physical problem that is likely related to use. Physical problems can include:  Severe constipation.  Poor nutrition.  Infertility.  Tuberculosis.  Aspiration pneumonia.  Infections such as human immunodeficiency virus (HIV) and hepatitis (from injecting opioids).  Continued use of opioids in spite of a mental problem that is likely related to use. Mental problems can include:  Depression.  Anxiety.  Hallucinations.  Sleep problems.  Loss of sexual function.  Need to use more and more opioids to get the same effect, or lessened effect over time with use of the same amount (tolerance).  Having withdrawal symptoms when opioid use is stopped, or using opioids to reduce or avoid withdrawal symptoms. Withdrawal symptoms include:  Depressed, anxious, or irritable mood.  Nausea, vomiting, diarrhea, or intestinal cramping.  Muscle aches or spasms.  Excessive tearing or runny nose.  Dilated pupils, sweating, or hairs standing on end.  Yawning.  Fever, raised blood pressure, or fast pulse.  Restlessness or trouble sleeping. This does not apply to people taking opioids for medical reasons only. DIAGNOSIS Opioid use disorder is diagnosed by your health care provider. You may be  asked questions about your opioid use and and how it  affects your life. A physical exam may be done. A drug screen may be ordered. You may be referred to a mental health professional. The diagnosis of opioid use disorder requires at least two symptoms within 12 months. The type of opioid use disorder you have depends on the number of signs and symptoms you have. The type may be:  Mild. Two or three signs and symptoms.   Moderate. Four or five signs and symptoms.   Severe. Six or more signs and symptoms. TREATMENT  Treatment is usually provided by mental health professionals with training in substance use disorders.The following options are available:  Detoxification.This is the first step in treatment for withdrawal. It is medically supervised withdrawal with the use of medicines. These medicines lessen withdrawal symptoms. They also raise the chance of becoming opioid free.  Counseling, also known as talk therapy. Talk therapy addresses the reasons you use opioids. It also addresses ways to keep you from using again (relapse). The goals of talk therapy are to avoid relapse by:  Identifying and avoiding triggers for use.  Finding healthy ways to cope with stress.  Learning how to handle cravings.  Support groups. Support groups provide emotional support, advice, and guidance.  A medicine that blocks opioid receptors in your brain. This medicine can reduce opioid cravings that lead to relapse. This medicine also blocks the desired opioid effect when relapse occurs.  Opioids that are taken by mouth in place of the misused opioid (opioid maintenance treatment). These medicines satisfy cravings but are safer than commonly misused opioids. This often is the best option for people who continue to relapse with other treatments. HOME CARE INSTRUCTIONS   Take medicines only as directed by your health care provider.  Check with your health care provider before starting new medicines.  Keep all follow-up visits as directed by your health care  provider. SEEK MEDICAL CARE IF:  You are not able to take your medicines as directed.  Your symptoms get worse. SEEK IMMEDIATE MEDICAL CARE IF:  You have serious thoughts about hurting yourself or others.  You may have taken an overdose of opioids. FOR MORE INFORMATION  National Institute on Drug Abuse: http://www.price-smith.com/www.drugabuse.gov  Substance Abuse and Mental Health Services Administration: [employer]ate[employer].com.ptwww.samhsa.gov   This information is not intended to replace advice given to you by your health care provider. Make sure you discuss any questions you have with your health care provider.   Document Released: 02/17/2007 Document Revised: 05/13/2014 Document Reviewed: 05/05/2013 Elsevier Interactive Patient Education [employer] Inc.

## 2015-09-28 NOTE — Progress Notes (Signed)
  Pt became up when he was escorted out of the 400 hall dayroom by a MHT.Several minutes after being escorted the patient asked to speak to the writer. Pt informed the writer of the incident stating that he has PTSD and does not like to be belittled or touched. Writer apologized and informed the pt that we don't make a habit of touching pt's, however,  because he ignored the mht , she placed her hand in attempt to usher him out of the room. Pt continued to escalate. Writer adm 0.5 of klonopin prn. Pt continued to be loud. Writer attempted to de escalate pt. Informed pt he could get 50mg  of benadryl or vistaril, pt refused. Contacted extender and received an order for geodon and benadryl. Pt refused stating that his "wife using geodon and it doesn't work for her". [facility], whom it was believed had diffused the situation. However, after several minutes pt began to escalate by wasting water, taking things of the wall and throwing them away, and being loud. Pt then picked up a chair stating that he was going to throw it out the window. Writer informed pt that she was going to contact the police. Pt began yelling louder, and followed the writer into the nurses station. Security was called from other campus. Extender gave an order for ativan 2mg  im, haldol 5mg  im and risperdal.

## 2015-09-28 NOTE — [facility] Suicide Risk Assessment (Signed)
[facility] INPATIENT:  Family/Significant Other Suicide Prevention Education  Suicide Prevention Education:  Education Completed; wife Selinda EonRebecca Kennard [phone],  (name of family member/significant other) has been identified by the patient as the family member/significant other with whom the patient will be residing, and identified as the person(s) who will aid the patient in the event of a mental health crisis (suicidal ideations/suicide attempt).  With written consent from the patient, the family member/significant other has been provided the following suicide prevention education, prior to the and/or following the discharge of the patient.  The suicide prevention education provided includes the following:  Suicide risk factors  Suicide prevention and interventions  National Suicide Hotline telephone number  [facility] telephone number  [facility]  [facility] and/or [facility] telephone number  Request made of family/significant other to:  Remove weapons (e.g., guns, rifles, knives), all items previously/currently identified as safety concern.    Remove drugs/medications (over-the-counter, prescriptions, illicit drugs), all items previously/currently identified as a safety concern.  The family member/significant other verbalizes understanding of the suicide prevention education information provided.  The family member/significant other agrees to remove the items of safety concern listed above.  Kaycen Whitworth, [place] CarboKristin L 09/28/2015, 3:11 PM

## 2015-09-28 NOTE — Progress Notes (Signed)
Pt discharged to lobby. All papers, samples and prescriptions were given and valuables returned. Verbal understanding expressed. Denies SI/HI and A/VH. Pt given opportunity to express concerns and ask questions.  Patient threatened to hold staff liable if anything happens to him after discharge.  [facility].  Patient refused to sign his discharge instruction and belongings form.

## 2015-09-28 NOTE — Progress Notes (Signed)
DAR NOTE: Patient is very irritable, angry and labile.  Refusing all his prescribed medications and repeatedly asking for controlled substance and benzodiazepine.  Nurse practitioner and MD assessed patient and discussed medication therapy with patient.  Patient became upset and demanded to leave the facility.  Patient was offered PRN medication and standing prescription but refused.  Maintained on routine safety checks.  Patient was verbally aggressive with staff and other patients on the unit.
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
## 2015-09-28 NOTE — [facility] Suicide Risk Assessment (Addendum)
[facility] Discharge Suicide Risk Assessment   Principal Problem: MDD (major depressive disorder), recurrent severe, without psychosis ([place]) Discharge Diagnoses:  Patient Active Problem List   Diagnosis Date Noted  . Opiate dependence ([place]) [F11.20] 09/28/2015  . Cocaine abuse [F14.10] 09/28/2015  . Sepsis ([place]) [A41.9] 03/02/2015  . Thrombocytopenia ([place]) [D69.6] 03/02/2015  . Abscess [L02.91]   . Depression with suicidal ideation [F32.9, R45.851] 02/06/2015  . Polysubstance abuse [F19.10] 12/21/2014  . Substance induced mood disorder ([place]) [F19.94] 12/21/2014  . PTSD (post-traumatic stress disorder) [F43.10] 12/21/2014  . MDD (major depressive disorder), recurrent severe, without psychosis ([place]) [F33.2] 12/21/2014    Total Time spent with patient: 30 minutes  Musculoskeletal: Strength & Muscle Tone: within normal limits Gait & Station: normal Patient leans: N/A  Psychiatric Specialty Exam: ROS  Blood pressure 104/63, pulse 87, temperature 98 F (36.7 C), temperature source Oral, resp. rate 17, height _0  (1.702 m), weight 187 lb (84.823 kg).Body mass index is 29.28 kg/(m^2).  General Appearance: Well Groomed  Eye Contact::  Good  Speech:  Normal Rate409  Volume:  Normal  Mood:  Irritable  Affect:  Congruent  Thought Process:  Linear  Orientation:  Full (Time, Place, and Person)  Thought Content:  focused on medication issues, denies hallucinations, no delusions   Suicidal Thoughts:  No- denies suicidal ideations, denies any self injurious ideations   Homicidal Thoughts:  No- denies any homicidal ideations, denies any thoughts of violence   Memory:  recent and remote grossly intact   Judgement:  Fair  Insight:  Fair  Psychomotor Activity:  Normal  Concentration:  Good  Recall:  Good  Fund of Knowledge:Good  Language: Good  Akathisia:  Negative  Handed:  Right  AIMS (if indicated):     Assets:  Desire for Improvement Resilience  Sleep:  Number of Hours: 4.75   Cognition: WNL  ADL's:  Intact   Mental Status Per Nursing Assessment::   On Admission:  NA  Demographic Factors:  35 year old employed, married male   Loss Factors: Had recently weaned self down /off methadone. Recent job change .  Historical Factors: History of Opiate Dependence, history of Depression, PTSD   Risk Reduction Factors:   Responsible for children under 68 years of age, Sense of responsibility to family, Employed, Living with another person, especially a relative and Positive coping skills or problem solving skills  Continued Clinical Symptoms:  As discussed with Nursing staff, patient reports he feels current medications not adequately addressing his withdrawal symptoms. He presented with increased irritability, became loud a little earlier, but gradually de escalated with support. I met with patient - he states he apologizes for having become upset but that he simply feels current medication regimen is not helping him at this time. He states  his intention is to return to [facility] Clinic " early tomorrow morning " in order to restart Methadone maintenance. States he does not feel it is realistic for him at this time to continue to  taper off opiates, proceed with detox protocol, because " it does not help, and being on Methadone does , it helps me stay calm, and be productive. My family supports me getting methadone maintenance ". Patient presents alert, attentive, mildly irritable , but affect improves as session progresses, no thought disorder, no SI , no HI, no psychotic symptoms, future oriented , 0x3. He is requesting discharge in order to go to [facility] early tomorrow morning  * I have reviewed medication side effects with  him- patient aware of sedating, overdose potential related to methadone/ BZD s, particularly in combination  Cognitive Features That Contribute To Risk:  No gross cognitive deficits noted upon discharge. Is alert , attentive, and oriented x  3   Suicide Risk:  Mild:  Suicidal ideation of limited frequency, intensity, duration, and specificity.  There are no identifiable plans, no associated intent, mild dysphoria and related symptoms, good self-control (both objective and subjective assessment), few other risk factors, and identifiable protective factors, including available and accessible social support.    Plan Of Care/Follow-up recommendations:  Activity:  as tolerated  Diet:  Regular  Tests:  NA Other:  see below  Patient is requesting discharge, as above, and at this time there are no grounds for involuntary commitment . As noted, he is planning to return to methadone maintenance clinic as of tomorrow AM to reinitiate Methadone maintenance.   Neita Garnet, MD 09/28/2015, 2:19 PM

## 2015-09-28 NOTE — [facility] Group Notes (Signed)
[facility] LCSW Group Therapy  09/28/2015 1:15 PM  Type of Therapy:  Group Therapy  Participation Level: Patient did not attend although encouraged to do so  Summary of Progress/Problems:  Finding Balance in Life. Today's group focused on defining balance in one's own words, identifying things that can knock one off balance, and exploring healthy ways to maintain balance in life. Group members were asked to provide an example of a time when they felt off balance, describe how they handled that situation,and process healthier ways to regain balance in the future. Group members were asked to share the most important tool for maintaining [facility] and how they plan to apply this method after discharge.    Carney Bernatherine C Kanylah Muench, LCSW

## 2016-04-29 IMAGING — US US MISC SOFT TISSUE
1 series · 11 of 11 positions shown · non-contrast
Comparison: No priors.

CLINICAL DATA: 34-year-old male with fevers and chills and palpable
lump underneath the left arm head with redness, warmth and
tenderness to the touch in this region for 1 day.

EXAM:
SOFT TISSUE ULTRASOUND - MISCELLANEOUS
TECHNIQUE: Focused ultrasound examination of the left axillary region over the
area of concern was performed.

[Series 1: us misc soft tissue · 0.02mm/px · 11 acquisitions, 11 frames shown]
[im 1/11]
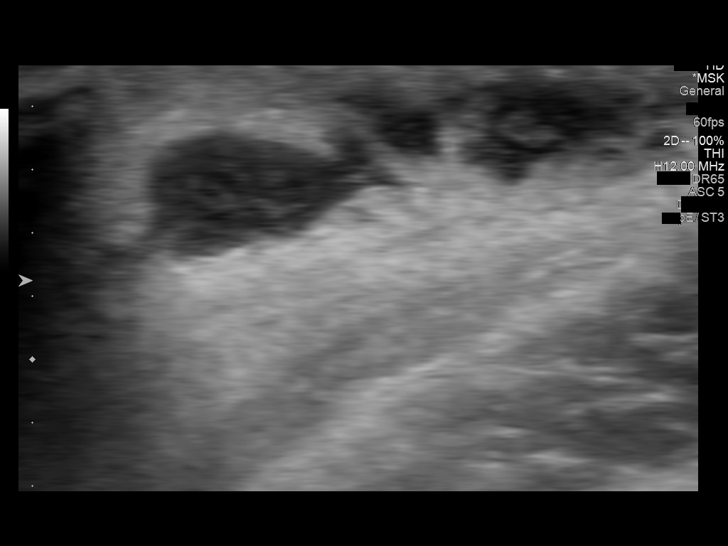
[im 2/11]
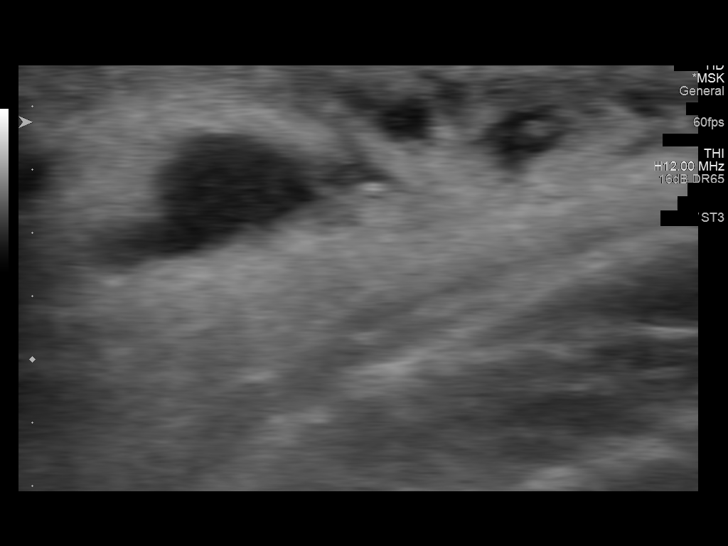
[im 3/11]
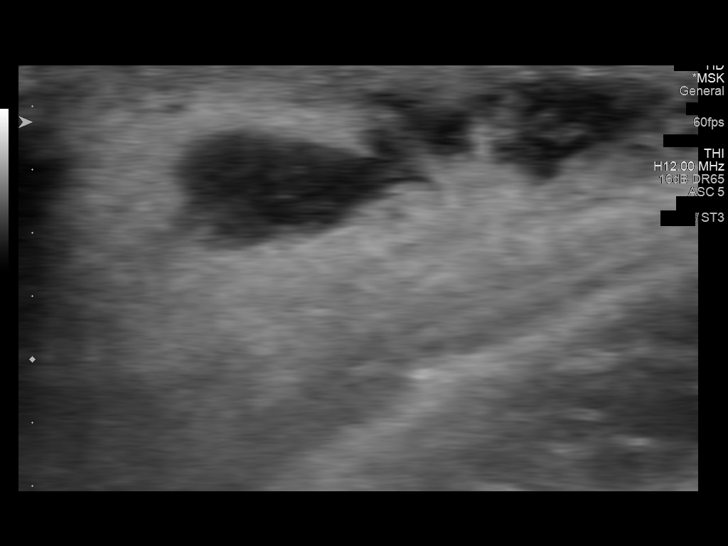
[im 4/11]
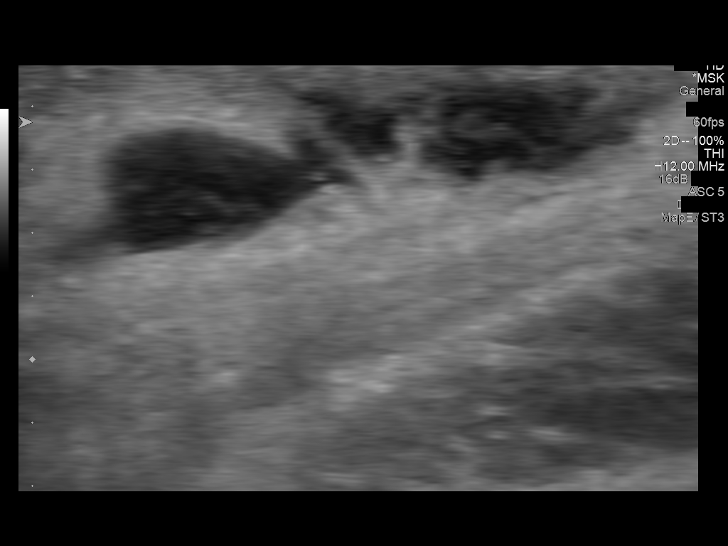
[im 5/11]
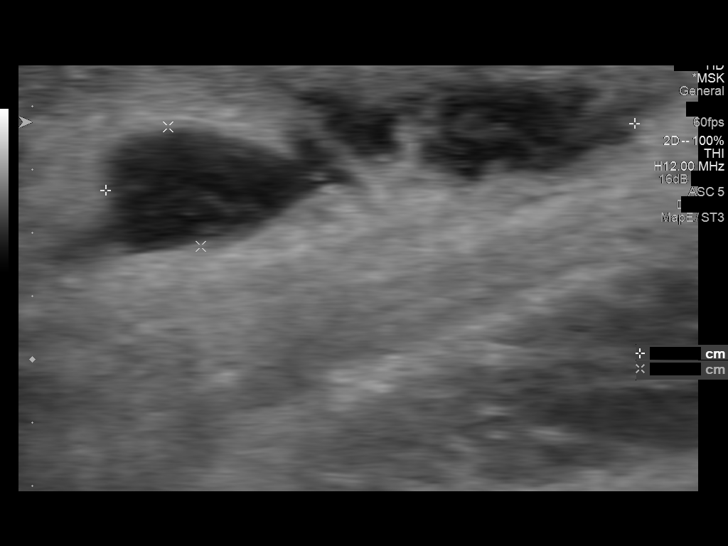
[im 6/11]
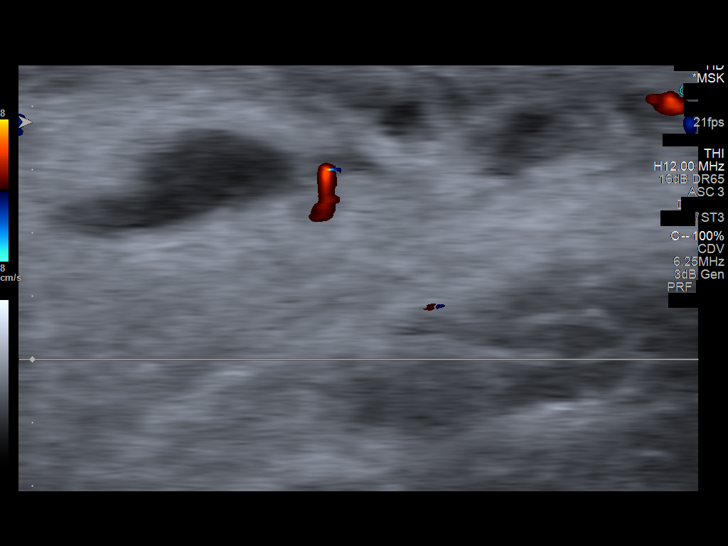
[im 7/11]
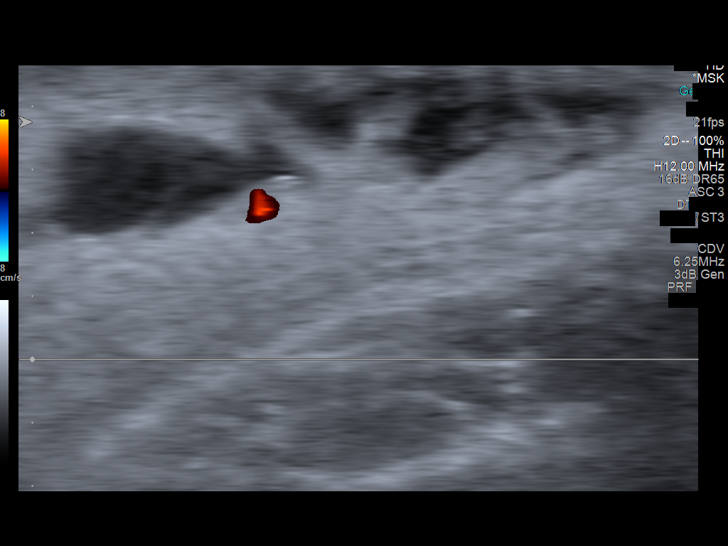
[im 8/11]
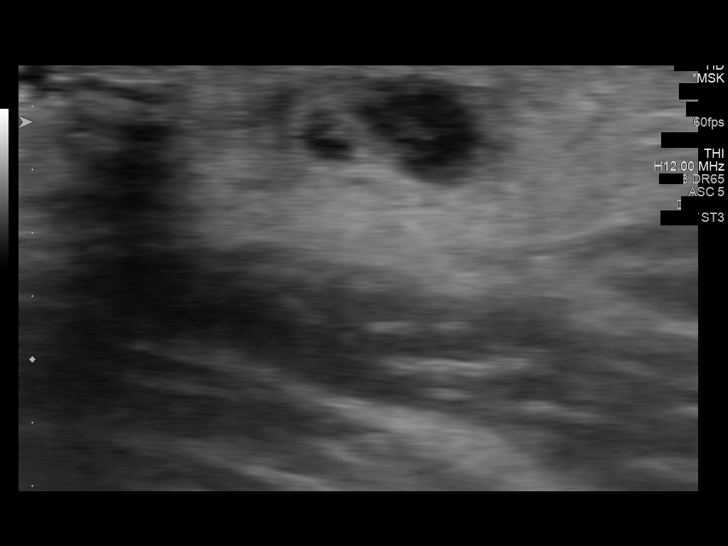
[im 9/11]
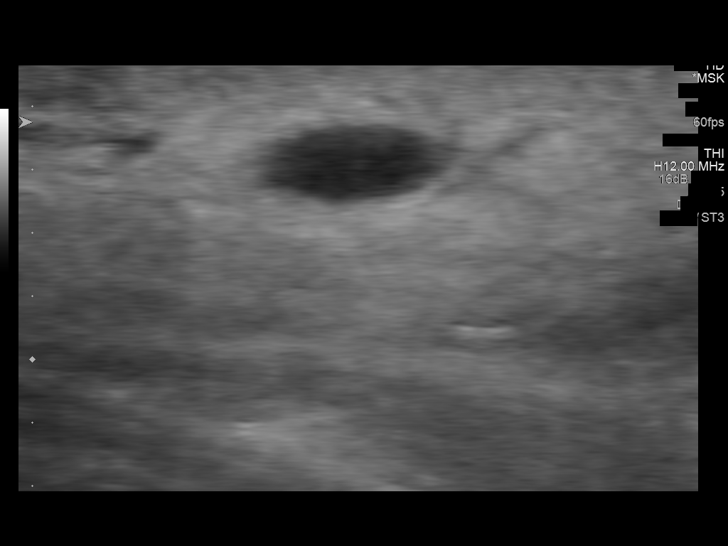
[im 10/11]
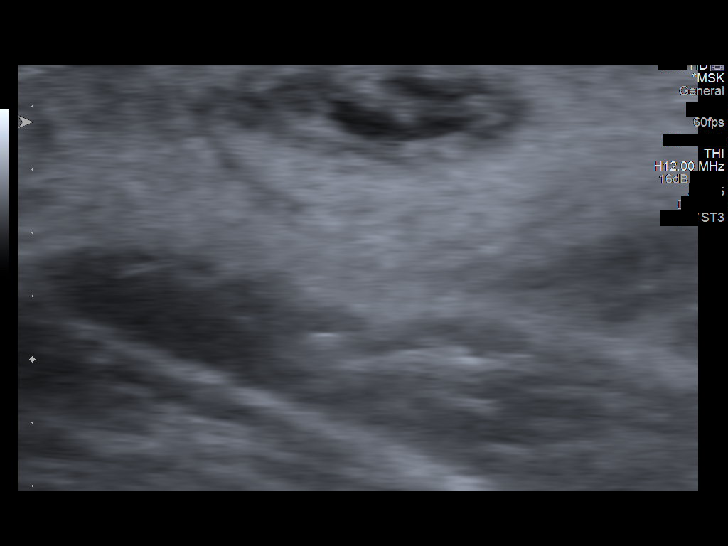
[im 11/11]
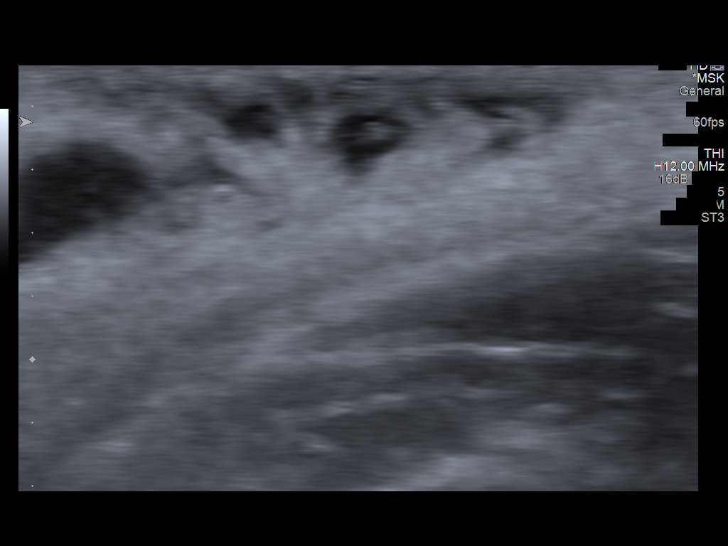

[11 of 11 positions shown; findings below may reference images not displayed]

FINDINGS: Focused ultrasound examination of the palpable abnormality in the
left axillary region demonstrated several small heterogeneously
hypoechoic areas with increased through transmission which measured
up to 1.7 x 0.4 x 0.4 cm, and appears to be intercommunicate,
concerning for phlegmonous inflammation and probable early abscess.
IMPRESSION: 1. Findings are concerning for an early abscess formation in the
left axillary region, as above.

## 2016-05-20 IMAGING — CT CT RENAL STONE PROTOCOL
2 of 4 series · 11 of 46 positions shown, 12 images · non-contrast
Comparison: None

CLINICAL DATA: Constant RIGHT flank pain, hematuria, kidney stones,
thinks he passed a stone last night, nausea, vomiting, low grade
fever, smoker

EXAM:
CT ABDOMEN AND PELVIS WITHOUT CONTRAST
TECHNIQUE: Multidetector CT imaging of the abdomen and pelvis was performed
following the standard protocol without IV contrast. Sagittal and
coronal MPR images reconstructed from axial data set. Oral contrast
not administered for this indication.

[Series 201: stone study, idose (2) · axial · 0.73mm/px · z∈[-445,-50]mm · 8 of 97 slices shown, 9 images]
[im 9/97  soft-tissue]
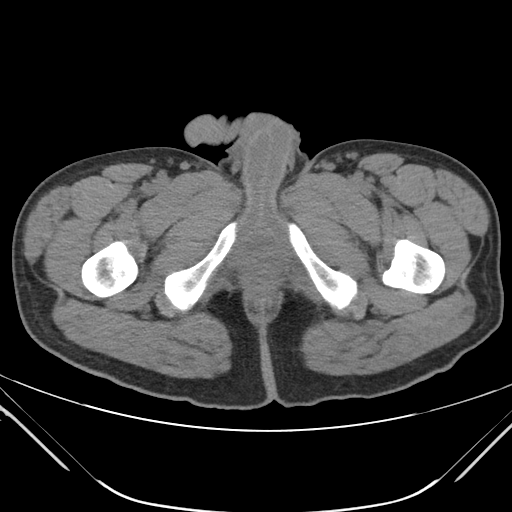
[im 9/97  bone]
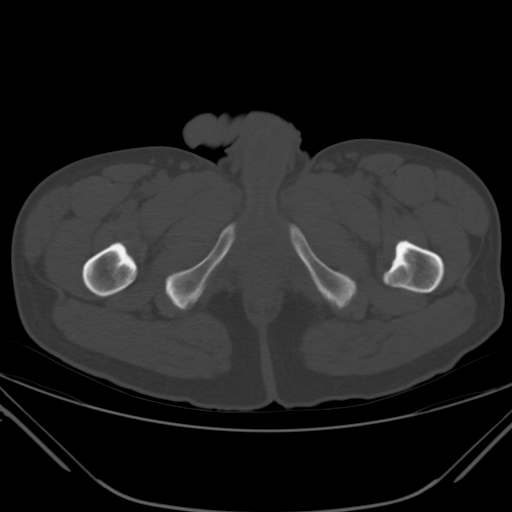
[im 21/97  soft-tissue]
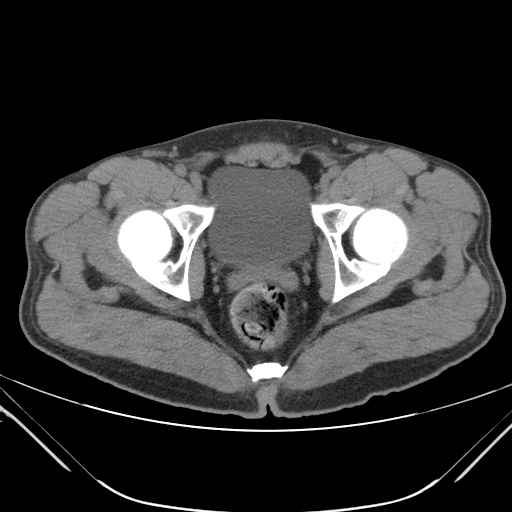
[im 30/97  soft-tissue]
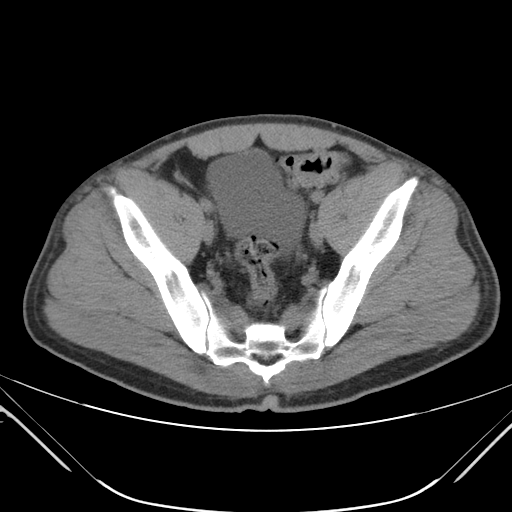
[im 42/97  soft-tissue]
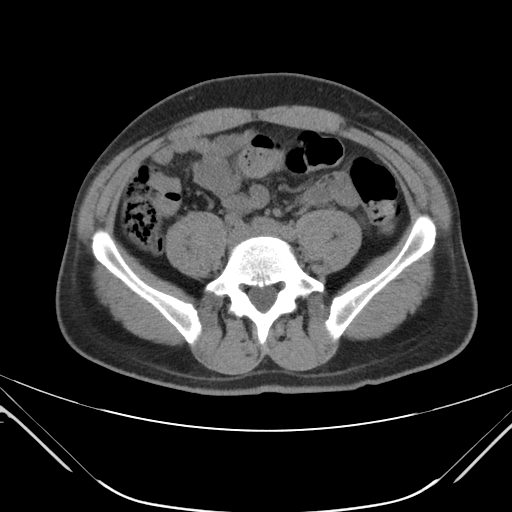
[im 55/97  soft-tissue]
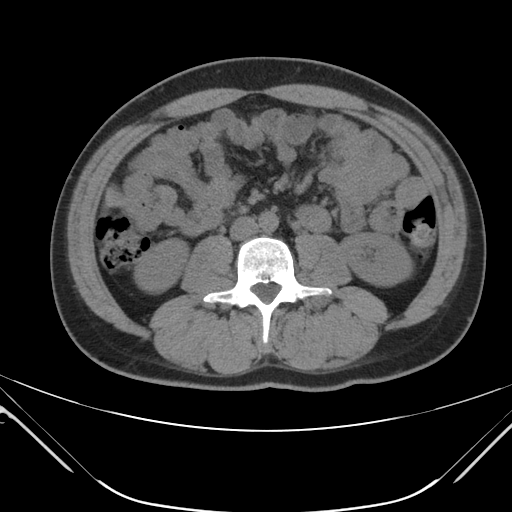
[im 67/97  soft-tissue]
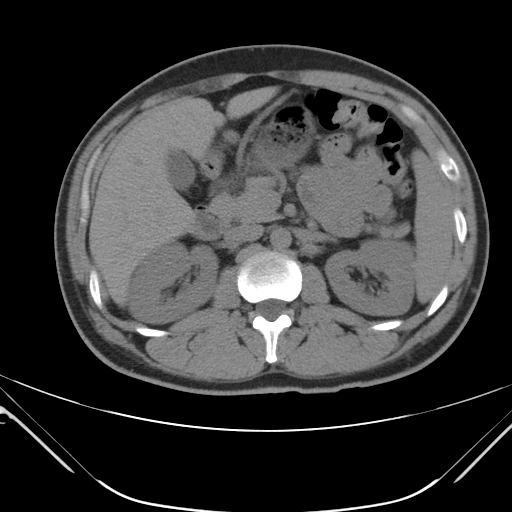
[im 76/97  soft-tissue]
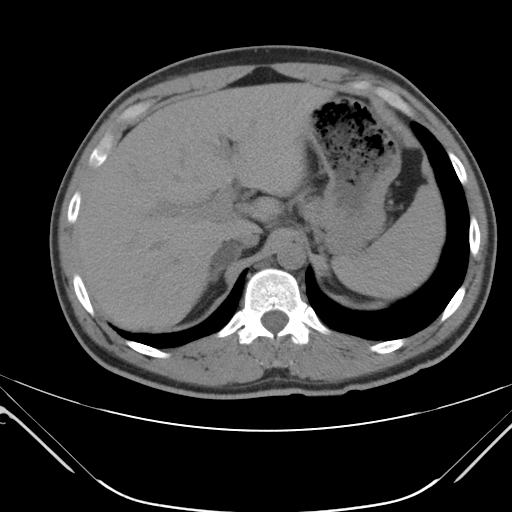
[im 88/97  soft-tissue]
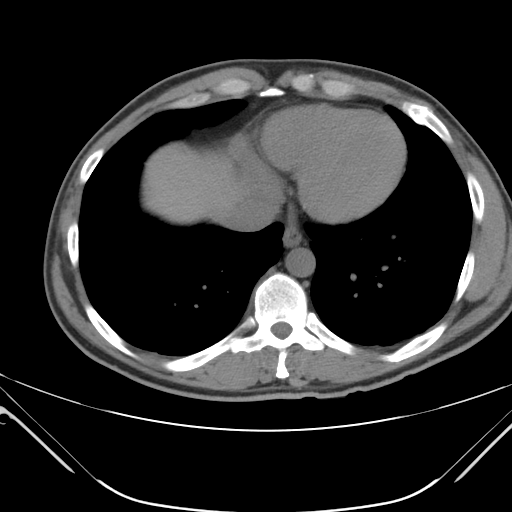

[Series 203: coronals, idose (2) · coronal · 0.45mm/px · 3 of 112 slices shown]
[im 38/112  soft-tissue]
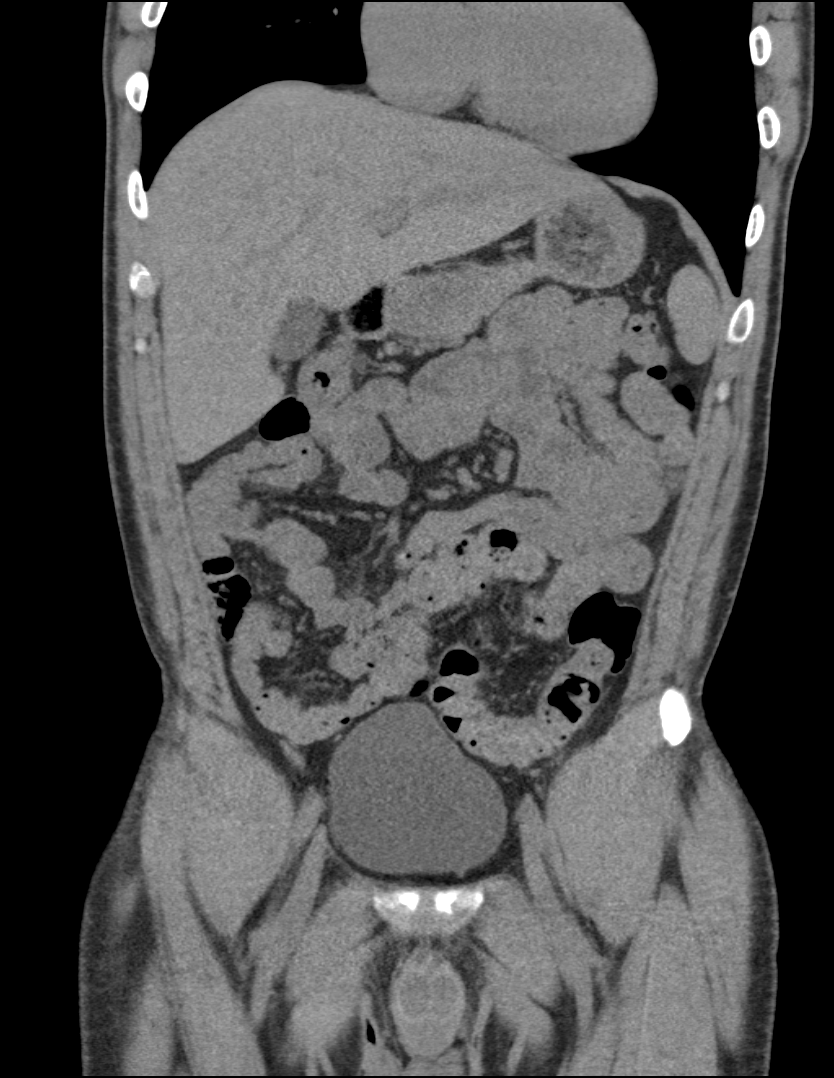
[im 50/112  soft-tissue]
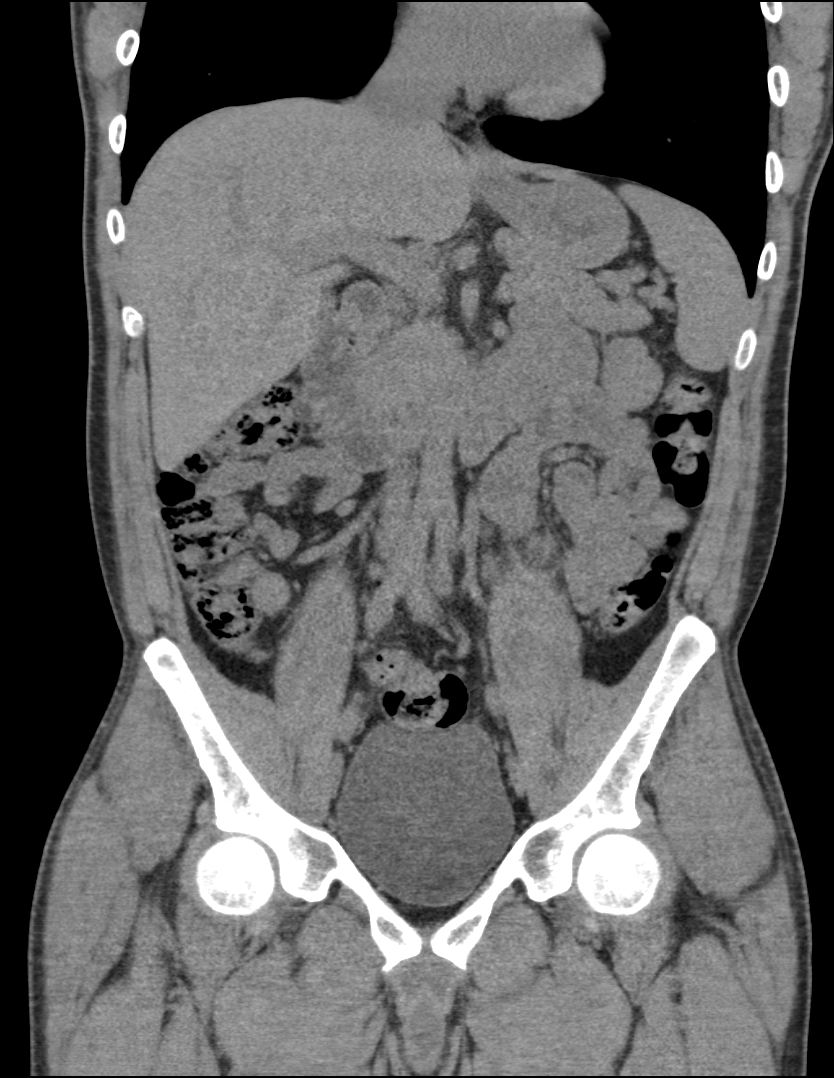
[im 62/112  soft-tissue]
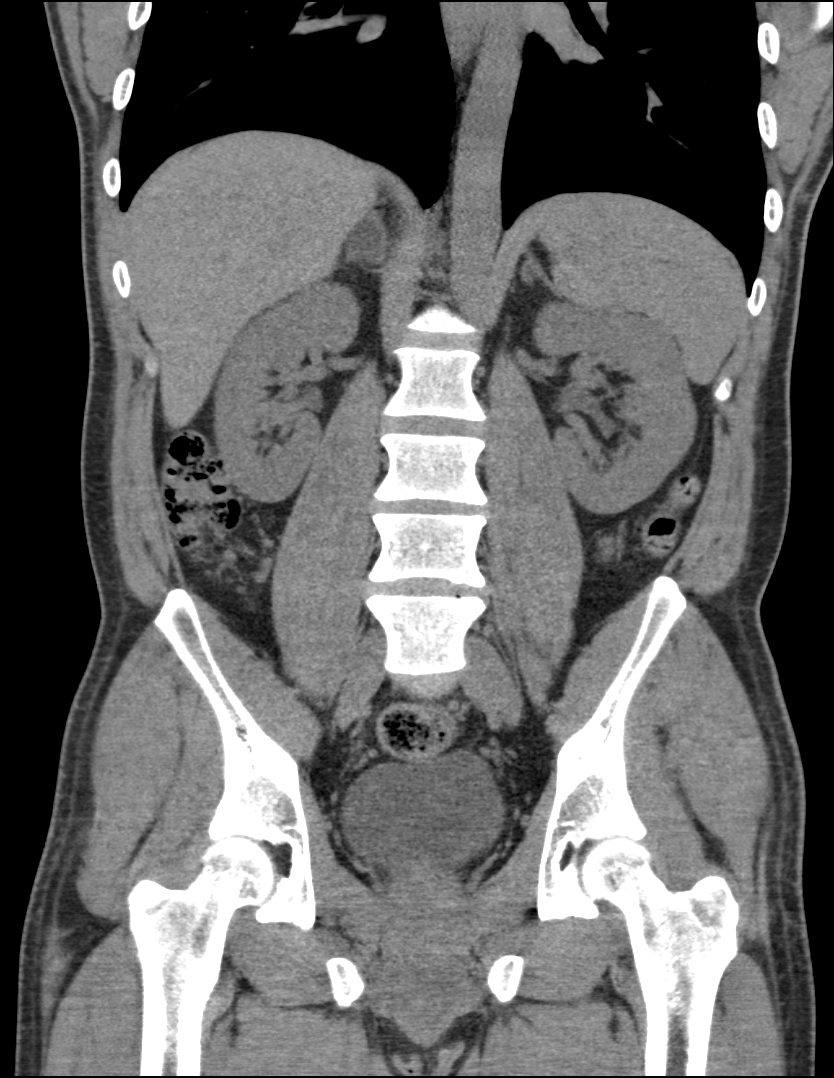

[11 of 46 positions shown; findings below may reference images not displayed]

FINDINGS: Minimal bibasilar atelectasis.

Tiny BILATERAL nonobstructing renal calculi.

No hydronephrosis, ureteral calcification or ureteral dilatation.

Bladder, ureters, and prostate gland grossly normal appearance.

Within limits of a nonenhanced exam, liver, spleen, pancreas,
kidneys, and LEFT adrenal gland otherwise normal.

Low-attenuation RIGHT adrenal mass 2.3 x 1.3 cm image 22 compatible
with adenoma.

Normal appendix without inflammatory changes.

Multiple normal size RIGHT lower quadrant lymph nodes near cecum,
nonspecific.

Stomach and bowel loops otherwise unremarkable.

Small umbilical hernia containing fat.

No mass, adenopathy, free air, or free fluid.

Osseous structures unremarkable.
IMPRESSION: BILATERAL nonobstructing renal calculi.

Small RIGHT adrenal adenoma.

Small umbilical hernia containing fat.

No acute abnormalities.
